# Patient Record
Sex: Female | Born: 1940 | Race: White | Hispanic: No | Marital: Married | Smoking: Never smoker
Health system: Southern US, Community
[De-identification: ages and names within clinical notes are randomized; demographics above are authoritative.]

## PROBLEM LIST (undated history)

## (undated) DIAGNOSIS — D369 Benign neoplasm, unspecified site: Secondary | ICD-10-CM

## (undated) DIAGNOSIS — F419 Anxiety disorder, unspecified: Secondary | ICD-10-CM

## (undated) DIAGNOSIS — K56609 Unspecified intestinal obstruction, unspecified as to partial versus complete obstruction: Secondary | ICD-10-CM

## (undated) DIAGNOSIS — N189 Chronic kidney disease, unspecified: Secondary | ICD-10-CM

## (undated) DIAGNOSIS — M179 Osteoarthritis of knee, unspecified: Secondary | ICD-10-CM

## (undated) DIAGNOSIS — R413 Other amnesia: Secondary | ICD-10-CM

## (undated) DIAGNOSIS — M1710 Unilateral primary osteoarthritis, unspecified knee: Secondary | ICD-10-CM

## (undated) DIAGNOSIS — M419 Scoliosis, unspecified: Secondary | ICD-10-CM

## (undated) DIAGNOSIS — E781 Pure hyperglyceridemia: Secondary | ICD-10-CM

## (undated) DIAGNOSIS — M719 Bursopathy, unspecified: Secondary | ICD-10-CM

## (undated) DIAGNOSIS — K589 Irritable bowel syndrome without diarrhea: Secondary | ICD-10-CM

## (undated) DIAGNOSIS — F410 Panic disorder [episodic paroxysmal anxiety] without agoraphobia: Secondary | ICD-10-CM

## (undated) DIAGNOSIS — Z8719 Personal history of other diseases of the digestive system: Secondary | ICD-10-CM

## (undated) DIAGNOSIS — I679 Cerebrovascular disease, unspecified: Secondary | ICD-10-CM

## (undated) DIAGNOSIS — K649 Unspecified hemorrhoids: Secondary | ICD-10-CM

## (undated) DIAGNOSIS — M1990 Unspecified osteoarthritis, unspecified site: Secondary | ICD-10-CM

## (undated) DIAGNOSIS — I10 Essential (primary) hypertension: Secondary | ICD-10-CM

## (undated) DIAGNOSIS — R269 Unspecified abnormalities of gait and mobility: Secondary | ICD-10-CM

## (undated) DIAGNOSIS — K219 Gastro-esophageal reflux disease without esophagitis: Secondary | ICD-10-CM

## (undated) DIAGNOSIS — H9319 Tinnitus, unspecified ear: Secondary | ICD-10-CM

## (undated) HISTORY — DX: Irritable bowel syndrome, unspecified: K58.9

## (undated) HISTORY — DX: Pure hyperglyceridemia: E78.1

## (undated) HISTORY — DX: Bursopathy, unspecified: M71.9

## (undated) HISTORY — DX: Panic disorder (episodic paroxysmal anxiety): F41.0

## (undated) HISTORY — PX: OTHER SURGICAL HISTORY: SHX169

## (undated) HISTORY — DX: Tinnitus, unspecified ear: H93.19

## (undated) HISTORY — DX: Other amnesia: R41.3

## (undated) HISTORY — DX: Scoliosis, unspecified: M41.9

## (undated) HISTORY — DX: Unspecified abnormalities of gait and mobility: R26.9

## (undated) HISTORY — PX: ROTATOR CUFF REPAIR: SHX139

## (undated) HISTORY — DX: Cerebrovascular disease, unspecified: I67.9

## (undated) HISTORY — DX: Unilateral primary osteoarthritis, unspecified knee: M17.10

## (undated) HISTORY — DX: Chronic kidney disease, unspecified: N18.9

## (undated) HISTORY — DX: Unspecified hemorrhoids: K64.9

## (undated) HISTORY — DX: Benign neoplasm, unspecified site: D36.9

## (undated) HISTORY — DX: Osteoarthritis of knee, unspecified: M17.9

## (undated) HISTORY — PX: ABDOMINAL HYSTERECTOMY: SHX81

---

## 1998-07-23 ENCOUNTER — Other Ambulatory Visit: Admission: RE | Admit: 1998-07-23 | Discharge: 1998-07-23 | Payer: Self-pay | Admitting: General Surgery

## 1998-07-30 ENCOUNTER — Encounter: Payer: Self-pay | Admitting: General Surgery

## 1998-07-30 ENCOUNTER — Ambulatory Visit (HOSPITAL_COMMUNITY): Admission: RE | Admit: 1998-07-30 | Discharge: 1998-07-30 | Payer: Self-pay | Admitting: General Surgery

## 1998-10-16 ENCOUNTER — Ambulatory Visit (HOSPITAL_COMMUNITY): Admission: RE | Admit: 1998-10-16 | Discharge: 1998-10-16 | Payer: Self-pay | Admitting: Gastroenterology

## 2001-10-26 ENCOUNTER — Other Ambulatory Visit: Admission: RE | Admit: 2001-10-26 | Discharge: 2001-10-26 | Payer: Self-pay | Admitting: Obstetrics & Gynecology

## 2001-11-23 ENCOUNTER — Encounter: Payer: Self-pay | Admitting: Emergency Medicine

## 2001-11-23 ENCOUNTER — Emergency Department (HOSPITAL_COMMUNITY): Admission: EM | Admit: 2001-11-23 | Discharge: 2001-11-23 | Payer: Self-pay | Admitting: Emergency Medicine

## 2002-03-21 ENCOUNTER — Ambulatory Visit (HOSPITAL_COMMUNITY): Admission: RE | Admit: 2002-03-21 | Discharge: 2002-03-21 | Payer: Self-pay

## 2002-10-29 ENCOUNTER — Other Ambulatory Visit: Admission: RE | Admit: 2002-10-29 | Discharge: 2002-10-29 | Payer: Self-pay | Admitting: Obstetrics & Gynecology

## 2003-01-29 ENCOUNTER — Encounter: Admission: RE | Admit: 2003-01-29 | Discharge: 2003-04-29 | Payer: Self-pay | Admitting: Internal Medicine

## 2004-03-23 ENCOUNTER — Ambulatory Visit (HOSPITAL_COMMUNITY): Admission: RE | Admit: 2004-03-23 | Discharge: 2004-03-23 | Payer: Self-pay | Admitting: Gastroenterology

## 2007-01-11 ENCOUNTER — Encounter: Admission: RE | Admit: 2007-01-11 | Discharge: 2007-01-11 | Payer: Self-pay | Admitting: Internal Medicine

## 2007-02-22 ENCOUNTER — Encounter: Admission: RE | Admit: 2007-02-22 | Discharge: 2007-02-22 | Payer: Self-pay | Admitting: Internal Medicine

## 2010-07-22 ENCOUNTER — Emergency Department (HOSPITAL_COMMUNITY): Payer: Medicare Other

## 2010-07-22 ENCOUNTER — Emergency Department (HOSPITAL_COMMUNITY)
Admission: EM | Admit: 2010-07-22 | Discharge: 2010-07-23 | Disposition: A | Discharge status: Home / Self Care | Payer: Medicare Other | Attending: Emergency Medicine | Admitting: Emergency Medicine

## 2010-07-22 DIAGNOSIS — K5641 Fecal impaction: Secondary | ICD-10-CM | POA: Insufficient documentation

## 2010-07-22 DIAGNOSIS — R109 Unspecified abdominal pain: Secondary | ICD-10-CM | POA: Insufficient documentation

## 2010-07-22 DIAGNOSIS — I10 Essential (primary) hypertension: Secondary | ICD-10-CM | POA: Insufficient documentation

## 2010-07-22 DIAGNOSIS — Z9889 Other specified postprocedural states: Secondary | ICD-10-CM | POA: Insufficient documentation

## 2010-07-23 ENCOUNTER — Encounter (HOSPITAL_COMMUNITY): Payer: Self-pay | Admitting: Radiology

## 2010-07-23 ENCOUNTER — Emergency Department (HOSPITAL_COMMUNITY): Payer: Medicare Other

## 2010-07-23 ENCOUNTER — Emergency Department (HOSPITAL_COMMUNITY)
Admission: EM | Admit: 2010-07-23 | Discharge: 2010-07-23 | Disposition: A | Discharge status: Home / Self Care | Payer: Medicare Other | Attending: Emergency Medicine | Admitting: Emergency Medicine

## 2010-07-23 DIAGNOSIS — F411 Generalized anxiety disorder: Secondary | ICD-10-CM | POA: Insufficient documentation

## 2010-07-23 DIAGNOSIS — R61 Generalized hyperhidrosis: Secondary | ICD-10-CM | POA: Insufficient documentation

## 2010-07-23 DIAGNOSIS — R079 Chest pain, unspecified: Secondary | ICD-10-CM | POA: Insufficient documentation

## 2010-07-23 DIAGNOSIS — I10 Essential (primary) hypertension: Secondary | ICD-10-CM | POA: Insufficient documentation

## 2010-07-23 DIAGNOSIS — R000 Tachycardia, unspecified: Secondary | ICD-10-CM | POA: Insufficient documentation

## 2010-07-23 DIAGNOSIS — M549 Dorsalgia, unspecified: Secondary | ICD-10-CM | POA: Insufficient documentation

## 2010-07-23 DIAGNOSIS — K8020 Calculus of gallbladder without cholecystitis without obstruction: Secondary | ICD-10-CM | POA: Insufficient documentation

## 2010-07-23 HISTORY — DX: Unspecified intestinal obstruction, unspecified as to partial versus complete obstruction: K56.609

## 2010-07-23 HISTORY — DX: Anxiety disorder, unspecified: F41.9

## 2010-07-23 HISTORY — DX: Essential (primary) hypertension: I10

## 2010-07-23 LAB — BASIC METABOLIC PANEL
Chloride: 97 mEq/L (ref 96–112)
Creatinine, Ser: 1.51 mg/dL — ABNORMAL HIGH (ref 0.4–1.2)
GFR calc Af Amer: 41 mL/min — ABNORMAL LOW (ref >60)
GFR calc non Af Amer: 34 mL/min — ABNORMAL LOW (ref >60)
Potassium: 4.2 mEq/L (ref 3.5–5.1)

## 2010-07-23 LAB — POCT CARDIAC MARKERS
CKMB, poc: 1.2 ng/mL (ref 1.0–8.0)
Myoglobin, poc: 276 ng/mL (ref 12–200)
Troponin i, poc: <0.05 ng/mL (ref 0.00–0.09)
Troponin i, poc: <0.05 ng/mL (ref 0.00–0.09)

## 2010-07-23 LAB — URINALYSIS, ROUTINE W REFLEX MICROSCOPIC
Ketones, ur: NEGATIVE mg/dL
Nitrite: NEGATIVE
Urobilinogen, UA: 0.2 mg/dL (ref 0.0–1.0)
pH: 7 (ref 5.0–8.0)

## 2010-07-23 LAB — DIFFERENTIAL
Lymphocytes Relative: 14 % (ref 12–46)
Lymphs Abs: 1.5 10*3/uL (ref 0.7–4.0)
Monocytes Relative: 9 % (ref 3–12)
Neutro Abs: 8.5 10*3/uL — ABNORMAL HIGH (ref 1.7–7.7)
Neutrophils Relative %: 78 % — ABNORMAL HIGH (ref 43–77)

## 2010-07-23 LAB — URINE MICROSCOPIC-ADD ON

## 2010-07-23 LAB — CBC
Hemoglobin: 11.3 g/dL — ABNORMAL LOW (ref 12.0–15.0)
MCH: 31.5 pg (ref 26.0–34.0)
MCV: 95 fL (ref 78.0–100.0)
RBC: 3.59 MIL/uL — ABNORMAL LOW (ref 3.87–5.11)

## 2010-07-23 LAB — BRAIN NATRIURETIC PEPTIDE: Pro B Natriuretic peptide (BNP): <30 pg/mL (ref 0.0–100.0)

## 2010-07-23 MED ORDER — IOHEXOL 300 MG/ML  SOLN
75.0000 mL | Freq: Once | INTRAMUSCULAR | Status: DC | PRN
  Administered 2010-07-23: 75 mL via INTRAVENOUS

## 2010-07-27 ENCOUNTER — Other Ambulatory Visit (HOSPITAL_COMMUNITY): Payer: Medicare Other

## 2010-11-06 NOTE — Op Note (Signed)
NAMEREEYA, BOUND NO.:  [id number]   MEDICAL RECORD NO.:  [id number]          PATIENT TYPE:  AMB   LOCATION:  ENDO                         FACILITY:  [facility]   PHYSICIAN:  Danise Edge, M.D.   DATE OF BIRTH:  23-Apr-1941   DATE OF PROCEDURE:  03/23/2004  DATE OF DISCHARGE:                                 OPERATIVE REPORT   PROCEDURE:  Colonoscopy.   PROCEDURE INDICATION:  Ms. Curtis Uriarte is a 70 year old female, born  Feb 28, 1941.  Three years ago, Ms. Kienitz underwent a colonoscopy; a  tubulovillous adenomatous polyp was removed from the rectum.   ENDOSCOPIST:  Danise Edge, M.D.   PREMEDICATION:  1.  Versed 9 mg.  2.  Demerol 50 mg.   DESCRIPTION OF PROCEDURE:  After obtaining informed consent, Ms. Copus was  placed in the left lateral decubitus position.  I administered intravenous  Demerol and intravenous Versed to achieve conscious sedation for the  procedure.  The patient's blood pressure, oxygen saturation, and cardiac  rhythm were monitored throughout the procedure and documented in the medical  record.   Anal inspection and digital rectal exam were normal.  The Olympus adjustable  pediatric colonoscope was introduced into the rectum and advanced to the  cecum.  Colonic preparation for the exam today was excellent.   RECTUM:  Normal.  SIGMOID COLON AND DESCENDING COLON:  Normal.  SPLENIC FLEXURE:  Normal.  TRANSVERSE COLON:  Normal.  HEPATIC FLEXURE:  Normal.  ASCENDING COLON:  Normal.  CECUM AND ILEOCECAL VALVE:  Normal.   ASSESSMENT:  Normal proctocolonoscopy to the cecum.   RECOMMENDATIONS:  Repeat colonoscopy in 5 years.      MJ/MEDQ  D:  03/23/2004  T:  03/23/2004  Job:  [id number]   cc:   Theressa Millard, M.D.  [address] [place]  [place] [address]  Fax: [phone]

## 2013-08-01 ENCOUNTER — Other Ambulatory Visit: Payer: Self-pay | Admitting: Gastroenterology

## 2013-08-20 ENCOUNTER — Encounter (HOSPITAL_COMMUNITY): Payer: Self-pay | Admitting: *Deleted

## 2013-08-20 ENCOUNTER — Encounter (HOSPITAL_COMMUNITY): Payer: Self-pay | Admitting: Pharmacy Technician

## 2013-09-11 ENCOUNTER — Encounter (HOSPITAL_COMMUNITY)
Admission: RE | Disposition: A | Discharge status: Home / Self Care | Payer: Self-pay | Source: Ambulatory Visit | Attending: Gastroenterology

## 2013-09-11 ENCOUNTER — Ambulatory Visit (HOSPITAL_COMMUNITY)
Admission: RE | Admit: 2013-09-11 | Discharge: 2013-09-11 | Disposition: A | Discharge status: Home / Self Care | Payer: Medicare Other | Source: Ambulatory Visit | Attending: Gastroenterology | Admitting: Gastroenterology

## 2013-09-11 ENCOUNTER — Encounter (HOSPITAL_COMMUNITY): Payer: Self-pay | Admitting: *Deleted

## 2013-09-11 ENCOUNTER — Ambulatory Visit (HOSPITAL_COMMUNITY): Payer: Medicare Other | Admitting: Anesthesiology

## 2013-09-11 ENCOUNTER — Encounter (HOSPITAL_COMMUNITY): Payer: Medicare Other | Admitting: Anesthesiology

## 2013-09-11 DIAGNOSIS — Z860100 Personal history of colon polyps, unspecified: Secondary | ICD-10-CM | POA: Insufficient documentation

## 2013-09-11 DIAGNOSIS — I10 Essential (primary) hypertension: Secondary | ICD-10-CM | POA: Insufficient documentation

## 2013-09-11 DIAGNOSIS — K449 Diaphragmatic hernia without obstruction or gangrene: Secondary | ICD-10-CM | POA: Insufficient documentation

## 2013-09-11 DIAGNOSIS — I679 Cerebrovascular disease, unspecified: Secondary | ICD-10-CM | POA: Insufficient documentation

## 2013-09-11 DIAGNOSIS — Z1211 Encounter for screening for malignant neoplasm of colon: Secondary | ICD-10-CM | POA: Insufficient documentation

## 2013-09-11 DIAGNOSIS — Z79899 Other long term (current) drug therapy: Secondary | ICD-10-CM | POA: Insufficient documentation

## 2013-09-11 DIAGNOSIS — R110 Nausea: Secondary | ICD-10-CM | POA: Insufficient documentation

## 2013-09-11 DIAGNOSIS — Z90710 Acquired absence of both cervix and uterus: Secondary | ICD-10-CM | POA: Insufficient documentation

## 2013-09-11 DIAGNOSIS — Z7982 Long term (current) use of aspirin: Secondary | ICD-10-CM | POA: Insufficient documentation

## 2013-09-11 DIAGNOSIS — E781 Pure hyperglyceridemia: Secondary | ICD-10-CM | POA: Insufficient documentation

## 2013-09-11 DIAGNOSIS — K219 Gastro-esophageal reflux disease without esophagitis: Secondary | ICD-10-CM | POA: Insufficient documentation

## 2013-09-11 HISTORY — PX: ESOPHAGOGASTRODUODENOSCOPY (EGD) WITH PROPOFOL: SHX5813

## 2013-09-11 HISTORY — DX: Unspecified osteoarthritis, unspecified site: M19.90

## 2013-09-11 HISTORY — PX: COLONOSCOPY WITH PROPOFOL: SHX5780

## 2013-09-11 HISTORY — DX: Gastro-esophageal reflux disease without esophagitis: K21.9

## 2013-09-11 HISTORY — DX: Personal history of other diseases of the digestive system: Z87.19

## 2013-09-11 SURGERY — ESOPHAGOGASTRODUODENOSCOPY (EGD) WITH PROPOFOL
Anesthesia: Monitor Anesthesia Care

## 2013-09-11 MED ORDER — FENTANYL CITRATE 0.05 MG/ML IJ SOLN
INTRAMUSCULAR | Status: AC
  Filled 2013-09-11: qty 2

## 2013-09-11 MED ORDER — SODIUM CHLORIDE 0.9 % IV SOLN: INTRAVENOUS | Status: DC

## 2013-09-11 MED ORDER — PROMETHAZINE HCL 25 MG/ML IJ SOLN: 6.2500 mg | INTRAMUSCULAR | Status: DC | PRN

## 2013-09-11 MED ORDER — LACTATED RINGERS IV SOLN
INTRAVENOUS | Status: DC | PRN
  Administered 2013-09-11: 11:00:00 via INTRAVENOUS

## 2013-09-11 MED ORDER — PROPOFOL 10 MG/ML IV BOLUS
INTRAVENOUS | Status: DC | PRN
  Administered 2013-09-11 (×4): 20 mg via INTRAVENOUS

## 2013-09-11 MED ORDER — FENTANYL CITRATE 0.05 MG/ML IJ SOLN
INTRAMUSCULAR | Status: DC | PRN
  Administered 2013-09-11: 50 ug via INTRAVENOUS

## 2013-09-11 MED ORDER — PROPOFOL 10 MG/ML IV BOLUS
INTRAVENOUS | Status: AC
  Filled 2013-09-11: qty 20

## 2013-09-11 MED ORDER — BUTAMBEN-TETRACAINE-BENZOCAINE 2-2-14 % EX AERO
INHALATION_SPRAY | CUTANEOUS | Status: DC | PRN
  Administered 2013-09-11: 2 via TOPICAL

## 2013-09-11 SURGICAL SUPPLY — 25 items

## 2013-09-11 NOTE — Anesthesia Postprocedure Evaluation (Signed)
  Anesthesia Post-op Note  Patient: Darlene Ball  Procedure(s) Performed: Procedure(s) (LRB): ESOPHAGOGASTRODUODENOSCOPY (EGD) WITH PROPOFOL (N/A) COLONOSCOPY WITH PROPOFOL (N/A)  Patient Location: PACU  Anesthesia Type: MAC  Level of Consciousness: awake and alert   Airway and Oxygen Therapy: Patient Spontanous Breathing  Post-op Pain: mild  Post-op Assessment: Post-op Vital signs reviewed, Patient's Cardiovascular Status Stable, Respiratory Function Stable, Patent Airway and No signs of Nausea or vomiting  Last Vitals:  Filed Vitals:   09/11/13 1224  BP: 123/76  Temp:   Resp: 13    Post-op Vital Signs: stable   Complications: No apparent anesthesia complications

## 2013-09-11 NOTE — Preoperative (Signed)
Beta Blockers   Reason not to administer Beta Blockers:Not Applicable 

## 2013-09-11 NOTE — Anesthesia Preprocedure Evaluation (Signed)
Anesthesia Evaluation  Patient identified by MRN, date of birth, ID band Patient awake    Reviewed: Allergy & Precautions, H&P , NPO status , Patient's Chart, lab work & pertinent test results  Airway Mallampati: II TM Distance: >3 FB Neck ROM: Full    Dental no notable dental hx.    Pulmonary neg pulmonary ROS,  breath sounds clear to auscultation  Pulmonary exam normal       Cardiovascular hypertension, Pt. on medications and Pt. on home beta blockers Rhythm:Regular Rate:Normal     Neuro/Psych negative neurological ROS  negative psych ROS   GI/Hepatic Neg liver ROS, GERD-  Medicated,  Endo/Other  negative endocrine ROS  Renal/GU negative Renal ROS  negative genitourinary   Musculoskeletal negative musculoskeletal ROS (+)   Abdominal   Peds negative pediatric ROS (+)  Hematology negative hematology ROS (+)   Anesthesia Other Findings   Reproductive/Obstetrics negative OB ROS                           Anesthesia Physical Anesthesia Plan  ASA: II  Anesthesia Plan: MAC   Post-op Pain Management:    Induction: Intravenous  Airway Management Planned: Nasal Cannula  Additional Equipment:   Intra-op Plan:   Post-operative Plan:   Informed Consent: I have reviewed the patients History and Physical, chart, labs and discussed the procedure including the risks, benefits and alternatives for the proposed anesthesia with the patient or authorized representative who has indicated his/her understanding and acceptance.   Dental advisory given  Plan Discussed with: CRNA and Surgeon  Anesthesia Plan Comments:         Anesthesia Quick Evaluation

## 2013-09-11 NOTE — Discharge Instructions (Signed)

## 2013-09-11 NOTE — Transfer of Care (Signed)
Immediate Anesthesia Transfer of Care Note  Patient: Darlene Ball  Procedure(s) Performed: Procedure(s): ESOPHAGOGASTRODUODENOSCOPY (EGD) WITH PROPOFOL (N/A) COLONOSCOPY WITH PROPOFOL (N/A)  Patient Location: PACU and Endoscopy Unit  Anesthesia Type:MAC  Level of Consciousness: awake, alert , oriented and patient cooperative  Airway & Oxygen Therapy: Patient Spontanous Breathing and Patient connected to nasal cannula oxygen  Post-op Assessment: Report given to PACU RN, Post -op Vital signs reviewed and stable and Patient moving all extremities  Post vital signs: Reviewed and stable  Complications: No apparent anesthesia complications

## 2013-09-11 NOTE — H&P (Signed)
  Problems: Chronic nausea on paroxetine and aspirin. Personal history of adenomatous colon polyps. Normal surveillance colonoscopy on 04/21/2009.  History: The patient is a 73 year old female born 12-Oct-1940. She has had adenomatous colon polyps removed colonoscopically in the past. She underwent a normal surveillance colonoscopy on 04/21/2009. She is scheduled to undergo a repeat surveillance colonoscopy today.  The patient chronically takes aspirin 81 mg daily plus paroxetine increasing her risk for gastrointestinal bleeding. She does take omeprazole each morning.  The patient has been experiencing early morning nausea on an empty stomach unassociated with abdominal pain, vomiting, heartburn, shortness of breath, or gastrointestinal bleeding. She denies a past history of peptic ulcer disease. She is scheduled to undergo a diagnostic esophagogastroduodenoscopy.  Medication allergies: Codeine causes nausea  Past medical history: Hypertension. Irritable bowel syndrome. Cerebrovascular disease. Panic disorder. Hypertriglyceridemia. Osteoarthritis of the right knee. Gastroesophageal reflux. 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 Hysterectomy. Bunionectomy. Bilateral cataract surgery. Rotator cuff surgery.  Exam: The patient is alert and lying comfortably on the endoscopy stretcher. Abdomen is soft and nontender to palpation. Cardiac exam reveals a regular rhythm. Lungs are clear to auscultation.  Plan: Proceed with surveillance colonoscopy and diagnostic esophagogastroduodenoscopy to evaluate chronic nausea.

## 2013-09-11 NOTE — Op Note (Signed)
Problems: Chronic nausea on aspirin and paroxetine. Personal history of adenomatous colon polyps removed colonoscopically. Normal surveillance colonoscopy performed on 04/21/2009.  Endoscopist: Earle Gell  Premedication: Propofol administered by anesthesia  Procedure: Diagnostic esophagogastroduodenoscopy The patient was placed in the left lateral decubitus position. The Pentax gastroscope was passed through the posterior hypopharynx into the proximal esophagus without difficulty. The hypopharynx, larynx, and vocal cords appeared normal.  Esophagoscopy: The proximal, mid, and lower segments of the esophageal mucosa appeared normal. The squamocolumnar junction was regular and noted at 35 cm from the incisor teeth.  Gastroscopy: There was a moderate sized hiatal hernia. Retroflexed view of the gastric cardia and fundus was normal. The gastric body, antrum, and pylorus appeared normal.  Duodenoscopy: The duodenal bulb and descending duodenum appeared normal.  Assessment: Normal esophagogastroduodenoscopy except for the presence of a moderate sized hiatal hernia  Procedure: Surveillance colonoscopy Anal inspection and digital rectal exam were normal. The Pentax pediatric colonoscope was introduced into the rectum and advanced to the cecum. A normal-appearing ileocecal valve and appendiceal orifice were identified. Colonic preparation for the exam today was good.  Rectum. Normal. Retroflex view of the distal rectum normal  Sigmoid colon and descending colon. Normal  Splenic flexure. Normal  Transverse colon. Normal  Hepatic flexure. Normal  Ascending colon. Normal  Cecum and ileocecal valve. Normal  Assessment: Normal surveillance proctocolonoscopy to the cecum.

## 2013-09-12 ENCOUNTER — Encounter (HOSPITAL_COMMUNITY): Payer: Self-pay | Admitting: Gastroenterology

## 2014-03-14 ENCOUNTER — Ambulatory Visit
Admission: RE | Admit: 2014-03-14 | Discharge: 2014-03-14 | Disposition: A | Discharge status: Home / Self Care | Payer: Medicare Other | Source: Ambulatory Visit | Attending: Internal Medicine | Admitting: Internal Medicine

## 2014-03-14 ENCOUNTER — Other Ambulatory Visit: Payer: Self-pay | Admitting: Internal Medicine

## 2014-03-14 DIAGNOSIS — M25551 Pain in right hip: Principal | ICD-10-CM

## 2014-03-14 DIAGNOSIS — G8929 Other chronic pain: Secondary | ICD-10-CM

## 2014-10-31 ENCOUNTER — Ambulatory Visit (INDEPENDENT_AMBULATORY_CARE_PROVIDER_SITE_OTHER): Payer: Medicare Other | Admitting: Neurology

## 2014-10-31 ENCOUNTER — Encounter: Payer: Self-pay | Admitting: Neurology

## 2014-10-31 ENCOUNTER — Other Ambulatory Visit: Payer: Self-pay | Admitting: Neurology

## 2014-10-31 VITALS — BP 135/74 | HR 74 | Ht 65.0 in | Wt 151.2 lb

## 2014-10-31 DIAGNOSIS — R413 Other amnesia: Secondary | ICD-10-CM

## 2014-10-31 DIAGNOSIS — E538 Deficiency of other specified B group vitamins: Secondary | ICD-10-CM

## 2014-10-31 DIAGNOSIS — R269 Unspecified abnormalities of gait and mobility: Secondary | ICD-10-CM | POA: Diagnosis not present

## 2014-10-31 HISTORY — DX: Other amnesia: R41.3

## 2014-10-31 HISTORY — DX: Unspecified abnormalities of gait and mobility: R26.9

## 2014-10-31 NOTE — Progress Notes (Signed)
Reason for visit: Memory disorder  Referring physician: Dr. Salley Hews Darlene Ball is a 74 y.o. female  History of present illness:  [name] is a 74 year old left-handed white female with a history of a mild memory disturbance that dates back one to two-year's. The patient has had some issues with word finding, remembering recent events. She has had difficulty repeating herself at times. She will misplace things about the house frequently. She still does the finances without difficulty. She stopped driving a car about 7 months ago upon the insistence of her husband, but it was not clear that she was having any safety issues while driving. The patient has also noted some difficulty with mild gait instability that began 1-2 years ago as well. The patient will stumble at times, but she has not had any falls. She does not require a cane for ambulation. She sleeps well, and has a good energy level during the day. She does have some alternating constipation and diarrhea, without bladder or bowel incontinence. She is sent to this office for further evaluation.  Past Medical History  Diagnosis Date  . Hypertension   . Anxiety   . Bowel obstruction   . GERD (gastroesophageal reflux disease)   . H/O hiatal hernia   . Arthritis   . IBS (irritable bowel syndrome)   . Cerebrovascular disease   . Panic disorder   . Hypertriglyceridemia   . OA (osteoarthritis) of knee     right  . Scoliosis   . Bursitis     hips and shoulders  . Tubulovillous adenoma   . Hemorrhoids   . Chronic kidney disease   . Tinnitus   . Memory difficulty 10/31/2014  . Gait disturbance 10/31/2014    Past Surgical History  Procedure Laterality Date  . Abdominal hysterectomy    . Bunionectomy bilateral    . Catarct surgery bilateral    . Esophagogastroduodenoscopy (egd) with propofol N/A 09/11/2013    Procedure: ESOPHAGOGASTRODUODENOSCOPY (EGD) WITH PROPOFOL;  Surgeon: Garlan Fair, MD;  Location: [facility]  ENDOSCOPY;  Service: Endoscopy;  Laterality: N/A;  . Colonoscopy with propofol N/A 09/11/2013    Procedure: COLONOSCOPY WITH PROPOFOL;  Surgeon: Garlan Fair, MD;  Location: [facility] ENDOSCOPY;  Service: Endoscopy;  Laterality: N/A;  . Rotator cuff repair      Family History  Problem Relation Age of Onset  . Breast cancer Mother   . Addison's disease Mother   . Dementia Mother   . Stroke Father   . Addison's disease Maternal Aunt     Social history:  reports that she has never smoked. She has never used smokeless tobacco. She reports that she does not drink alcohol or use illicit drugs.  Medications:  Prior to Admission medications   Medication Sig Start Date End Date Taking? Authorizing Provider  Calcium Carbonate-Vitamin D (CALCIUM 600+D) 600-400 MG-UNIT per tablet Take 1 tablet by mouth 2 (two) times daily.   Yes Historical Provider, MD  clonazePAM (KLONOPIN) 1 MG tablet Take 1 mg by mouth 2 (two) times daily.   Yes Historical Provider, MD  conjugated estrogens (PREMARIN) vaginal cream Place 1 Applicatorful vaginally daily.   Yes Historical Provider, MD  fluticasone (FLONASE) 50 MCG/ACT nasal spray Place 2 sprays into both nostrils daily.   Yes Historical Provider, MD  lisinopril-hydrochlorothiazide (PRINZIDE,ZESTORETIC) 20-12.5 MG per tablet Take 1 tablet by mouth every morning.   Yes Historical Provider, MD  metoprolol tartrate (LOPRESSOR) 25 MG tablet Take 12.5 mg by mouth  2 (two) times daily.   Yes Historical Provider, MD  Multiple Vitamin (MULTIVITAMIN WITH MINERALS) TABS tablet Take 1 tablet by mouth daily.   Yes Historical Provider, MD  omeprazole (PRILOSEC) 40 MG capsule Take 40 mg by mouth daily.   Yes Historical Provider, MD  PARoxetine (PAXIL) 30 MG tablet Take 30 mg by mouth every morning.   Yes Historical Provider, MD  vitamin B-12 (CYANOCOBALAMIN) 1000 MCG tablet Take 1,000 mcg by mouth daily.   Yes Historical Provider, MD      Allergies  Allergen Reactions  . Codeine  Nausea Only    ROS:  Out of a complete 14 system review of symptoms, the patient complains only of the following symptoms, and all other reviewed systems are negative.  Memory loss, confusion, tremor Depression, anxiety, too much sleep Runny nose Diarrhea, constipation Urinary urgency  Blood pressure 135/74, pulse 74, height 5\' 5"  (1.651 m), weight 151 lb 3.2 oz (68.584 kg).  Physical Exam  General: The patient is alert and cooperative at the time of the examination.  Eyes: Pupils are equal, round, and reactive to light. Discs are flat bilaterally.  Neck: The neck is supple, no carotid bruits are noted.  Respiratory: The respiratory examination is clear.  Cardiovascular: The cardiovascular examination reveals a regular rate and rhythm, no obvious murmurs or rubs are noted.  Skin: Extremities are without significant edema.  Neurologic Exam  Mental status: The patient is alert and oriented x 3 at the time of the examination. The patient has apparent normal recent and remote memory, with an apparently normal attention span and concentration ability. Mini-Mental Status Examination done today shows a total score 29/30.  Cranial nerves: Facial symmetry is present. There is good sensation of the face to pinprick and soft touch bilaterally. The strength of the facial muscles and the muscles to head turning and shoulder shrug are normal bilaterally. Speech is well enunciated, no aphasia or dysarthria is noted. Extraocular movements are full. Visual fields are full. The tongue is midline, and the patient has symmetric elevation of the soft palate. No obvious hearing deficits are noted.  Motor: The motor testing reveals 5 over 5 strength of all 4 extremities. Good symmetric motor tone is noted throughout.  Sensory: Sensory testing is intact to pinprick, soft touch, vibration sensation, and position sense on all 4 extremities. No evidence of extinction is noted.  Coordination: Cerebellar  testing reveals good finger-nose-finger and heel-to-shin bilaterally.  Gait and station: Gait is normal. Tandem gait is minimally unsteady. Romberg is negative. No drift is seen.  Reflexes: Deep tendon reflexes are symmetric and normal bilaterally. Toes are downgoing bilaterally.   Assessment/Plan:  1. Mild memory disturbance  2. Mild gait disturbance  The patient will undergo an evaluation for the memory and gait problems. MRI of the brain will be done, and blood work will be done today. If the patient desires, medication such as Aricept may be added in the future. She will follow-up in about 6 months.  Jill Alexanders MD 10/31/2014 7:45 PM  [facility] [address] [place], [place] [address]  Phone [phone] Fax [phone]

## 2014-10-31 NOTE — Patient Instructions (Signed)

## 2014-11-02 LAB — COPPER, SERUM: Copper: 99 ug/dL (ref 72–166)

## 2014-11-02 LAB — VITAMIN B12: VITAMIN B 12: 874 pg/mL (ref 211–946)

## 2014-11-02 LAB — METHYLMALONIC ACID, SERUM: Methylmalonic Acid: 351 nmol/L (ref 0–378)

## 2014-11-02 LAB — RPR: RPR Ser Ql: NONREACTIVE

## 2014-11-05 ENCOUNTER — Telehealth: Payer: Self-pay

## 2014-11-05 NOTE — Telephone Encounter (Signed)
Left voicemail relaying results. 

## 2014-11-05 NOTE — Telephone Encounter (Signed)
-----   Message from Kathrynn Ducking, MD sent at 11/02/2014  5:41 PM EDT -----  The blood work results are unremarkable. Please call the patient.  ----- Message -----    From: [facility] Lab Results In Interface    Sent: 11/01/2014   7:49 AM      To: Kathrynn Ducking, MD

## 2015-04-09 ENCOUNTER — Other Ambulatory Visit: Payer: Self-pay | Admitting: Internal Medicine

## 2015-04-09 DIAGNOSIS — I2583 Coronary atherosclerosis due to lipid rich plaque: Principal | ICD-10-CM

## 2015-04-09 DIAGNOSIS — I2510 Atherosclerotic heart disease of native coronary artery without angina pectoris: Secondary | ICD-10-CM

## 2015-04-14 ENCOUNTER — Other Ambulatory Visit: Payer: Self-pay

## 2015-04-15 ENCOUNTER — Ambulatory Visit
Admission: RE | Admit: 2015-04-15 | Discharge: 2015-04-15 | Disposition: A | Discharge status: Home / Self Care | Payer: Medicare Other | Source: Ambulatory Visit | Attending: Internal Medicine | Admitting: Internal Medicine

## 2015-04-15 DIAGNOSIS — I2510 Atherosclerotic heart disease of native coronary artery without angina pectoris: Secondary | ICD-10-CM

## 2015-04-15 DIAGNOSIS — I2583 Coronary atherosclerosis due to lipid rich plaque: Principal | ICD-10-CM

## 2015-05-06 ENCOUNTER — Ambulatory Visit (INDEPENDENT_AMBULATORY_CARE_PROVIDER_SITE_OTHER): Payer: Medicare Other | Admitting: Adult Health

## 2015-05-06 ENCOUNTER — Encounter: Payer: Self-pay | Admitting: Adult Health

## 2015-05-06 VITALS — BP 108/69 | HR 77 | Ht 65.0 in | Wt 152.0 lb

## 2015-05-06 DIAGNOSIS — R413 Other amnesia: Secondary | ICD-10-CM | POA: Diagnosis not present

## 2015-05-06 MED ORDER — DONEPEZIL HCL 5 MG PO TABS: 5.0000 mg | ORAL_TABLET | Freq: Every day | ORAL | Status: DC

## 2015-05-06 NOTE — Progress Notes (Signed)
PATIENT: Darlene Ball DOB: 01/09/1941  REASON FOR VISIT: follow up- memory HISTORY FROM: patient  HISTORY OF PRESENT ILLNESS: Darlene Ball is a 74 year old female with a history of memory disturbance. She returns today for follow-up. Overall the patient feels that her memory has remained the same. She states that she does have a hard time remembering the date. She is able to complete all ADLs independently. However she does ask her husband for assistance getting in and out of the bathtub. She completes all of the finances however recently she has had some late payments. She no longer cooks meals anymore since they retired. They typically go out to eat. She no longer operates a motor vehicle. Patient feels that her balance is still a little off however she denies any falls. She does not use any assistive device when ambulate. She denies any new neurological symptoms. She returns today for an evaluation.  HISTORY 10/31/14 (WILLIS):Darlene Ball is a 74 year old left-handed white female with a history of a mild memory disturbance that dates back one to two-year's. The patient has had some issues with word finding, remembering recent events. She has had difficulty repeating herself at times. She will misplace things about the house frequently. She still does the finances without difficulty. She stopped driving a car about 7 months ago upon the insistence of her husband, but it was not clear that she was having any safety issues while driving. The patient has also noted some difficulty with mild gait instability that began 1-2 years ago as well. The patient will stumble at times, but she has not had any falls. She does not require a cane for ambulation. She sleeps well, and has a good energy level during the day. She does have some alternating constipation and diarrhea, without bladder or bowel incontinence. She is sent to this office for further evaluation.  REVIEW OF SYSTEMS: Out of a complete 14 system  review of symptoms, the patient complains only of the following symptoms, and all other reviewed systems are negative.  Activity change, eye discharge, runny nose, constipation, diarrhea, urine decrease, back pain, walking difficulty, confusion, memory loss  ALLERGIES: Allergies  Allergen Reactions  . Codeine Nausea Only    HOME MEDICATIONS: Outpatient Prescriptions Prior to Visit  Medication Sig Dispense Refill  . Calcium Carbonate-Vitamin D (CALCIUM 600+D) 600-400 MG-UNIT per tablet Take 1 tablet by mouth 2 (two) times daily.    . clonazePAM (KLONOPIN) 1 MG tablet Take 1 mg by mouth 2 (two) times daily.    Marland Kitchen conjugated estrogens (PREMARIN) vaginal cream Place 1 Applicatorful vaginally daily.    . fluticasone (FLONASE) 50 MCG/ACT nasal spray Place 2 sprays into both nostrils daily.    Marland Kitchen lisinopril-hydrochlorothiazide (PRINZIDE,ZESTORETIC) 20-12.5 MG per tablet Take 1 tablet by mouth every morning.    . metoprolol tartrate (LOPRESSOR) 25 MG tablet Take 12.5 mg by mouth 2 (two) times daily.    . Multiple Vitamin (MULTIVITAMIN WITH MINERALS) TABS tablet Take 1 tablet by mouth daily.    Marland Kitchen omeprazole (PRILOSEC) 40 MG capsule Take 40 mg by mouth daily.    Marland Kitchen PARoxetine (PAXIL) 30 MG tablet Take 30 mg by mouth every morning.    . vitamin B-12 (CYANOCOBALAMIN) 1000 MCG tablet Take 1,000 mcg by mouth daily.     No facility-administered medications prior to visit.    PAST MEDICAL HISTORY: Past Medical History  Diagnosis Date  . Hypertension   . Anxiety   . Bowel obstruction ([place])   .  GERD (gastroesophageal reflux disease)   . H/O hiatal hernia   . Arthritis   . IBS (irritable bowel syndrome)   . Cerebrovascular disease   . Panic disorder   . Hypertriglyceridemia   . OA (osteoarthritis) of knee     right  . Scoliosis   . Bursitis     hips and shoulders  . Tubulovillous adenoma   . Hemorrhoids   . Chronic kidney disease   . Tinnitus   . Memory difficulty 10/31/2014  . Gait  disturbance 10/31/2014    PAST SURGICAL HISTORY: Past Surgical History  Procedure Laterality Date  . Abdominal hysterectomy    . Bunionectomy bilateral    . Catarct surgery bilateral    . Esophagogastroduodenoscopy (egd) with propofol N/A 09/11/2013    Procedure: ESOPHAGOGASTRODUODENOSCOPY (EGD) WITH PROPOFOL;  Surgeon: Garlan Fair, MD;  Location: [facility] ENDOSCOPY;  Service: Endoscopy;  Laterality: N/A;  . Colonoscopy with propofol N/A 09/11/2013    Procedure: COLONOSCOPY WITH PROPOFOL;  Surgeon: Garlan Fair, MD;  Location: [facility] ENDOSCOPY;  Service: Endoscopy;  Laterality: N/A;  . Rotator cuff repair      FAMILY HISTORY: Family History  Problem Relation Age of Onset  . Breast cancer Mother   . Addison's disease Mother   . Dementia Mother   . Stroke Father   . Addison's disease Maternal Aunt     SOCIAL HISTORY: Social History   Social History  . Marital Status: Married    Spouse Name: N/A  . Number of Children: 1  . Years of Education: 12   Occupational History  . retired    Social History Main Topics  . Smoking status: Never Smoker   . Smokeless tobacco: Never Used  . Alcohol Use: No  . Drug Use: No  . Sexual Activity: Not on file   Other Topics Concern  . Not on file   Social History Narrative   Patient drinks caffeine occasionally.   Patient is left handed.      PHYSICAL EXAM  Filed Vitals:   05/06/15 1328  BP: 108/69  Pulse: 77  Height: 5\' 5"  (1.651 m)  Weight: 152 lb (68.947 kg)   Body mass index is 25.29 kg/(m^2).  MMSE - Mini Mental State Exam 05/06/2015 10/31/2014  Orientation to time 4 5  Orientation to Place 5 5  Registration 3 3  Attention/ Calculation 5 5  Recall 3 2  Language- name 2 objects 2 2  Language- repeat 1 1  Language- follow 3 step command 3 3  Language- read & follow direction 1 1  Write a sentence 1 1  Copy design 1 1  Total score 29 29   Generalized: Well developed, in no acute distress   Neurological  examination  Mentation: Alert oriented to time, place, history taking. Follows all commands speech and language fluent Cranial nerve II-XII: Pupils were equal round reactive to light. Extraocular movements were full, visual field were full on confrontational test. Facial sensation and strength were normal. Uvula tongue midline. Head turning and shoulder shrug  were normal and symmetric. Motor: The motor testing reveals 5 over 5 strength of all 4 extremities. Good symmetric motor tone is noted throughout.  Sensory: Sensory testing is intact to soft touch on all 4 extremities. No evidence of extinction is noted.  Coordination: Cerebellar testing reveals good finger-nose-finger and heel-to-shin bilaterally.  Gait and station: Gait is normal. Tandem gait is normal. Romberg is negative. No drift is seen.  Reflexes: Deep tendon reflexes are  symmetric and normal bilaterally.   DIAGNOSTIC DATA (LABS, IMAGING, TESTING) - I reviewed patient records, labs, notes, testing and imaging myself where available.     ASSESSMENT AND PLAN 74 y.o. year old female  has a past medical history of Hypertension; Anxiety; Bowel obstruction ([place]); GERD (gastroesophageal reflux disease); H/O hiatal hernia; Arthritis; IBS (irritable bowel syndrome); Cerebrovascular disease; Panic disorder; Hypertriglyceridemia; OA (osteoarthritis) of knee; Scoliosis; Bursitis; Tubulovillous adenoma; Hemorrhoids; Chronic kidney disease; Tinnitus; Memory difficulty (10/31/2014); and Gait disturbance (10/31/2014). here with:  1. Memory disturbance  Overall the patient's memory has remained stable. Her MMSE today is 29/30. I have spoken to the patient about potentially starting memory medication such as Aricept. The patient is amenable to trying this medication. I will start her on Aricept 5 mg daily. I have informed the patient that taking Aricept in addition to Paxil can present with cholinergic symptoms. I have reviewed the symptoms with the  patient. She verbalized understanding. Patient advised that if her symptoms worsen or she develops any new symptoms she should let us know. She will follow-up in 3-4 months or sooner if needed.  I spent 15 minutes with the patient 50% of this time was spent counseling the patient on the  medication Aricept.   Ward Givens, MSN, NP-C 05/06/2015, 2:33 PM [facility] [address] [place], [place] [address] [phone]

## 2015-05-06 NOTE — Progress Notes (Signed)
I have read the note, and I agree with the clinical assessment and plan.  Braycen Burandt KEITH   

## 2015-05-06 NOTE — Patient Instructions (Addendum)
Memory score is stable.  We will continue to monitor the memory Begin Aricept.   Donepezil tablets What is this medicine? DONEPEZIL (doe NEP e zil) is used to treat mild to moderate dementia caused by Alzheimer's disease. This medicine may be used for other purposes; ask your health care provider or pharmacist if you have questions. What should I tell my health care provider before I take this medicine? They need to know if you have any of these conditions: -asthma or other lung disease -difficulty passing urine -head injury -heart disease -history of irregular heartbeat -liver disease -seizures (convulsions) -stomach or intestinal disease, ulcers or stomach bleeding -an unusual or allergic reaction to donepezil, other medicines, foods, dyes, or preservatives -pregnant or trying to get pregnant -breast-feeding How should I use this medicine? Take this medicine by mouth with a glass of water. Follow the directions on the prescription label. You may take this medicine with or without food. Take this medicine at regular intervals. This medicine is usually taken before bedtime. Do not take it more often than directed. Continue to take your medicine even if you feel better. Do not stop taking except on your doctor's advice. If you are taking the 23 mg donepezil tablet, swallow it whole; do not cut, crush, or chew it. Talk to your pediatrician regarding the use of this medicine in children. Special care may be needed. Overdosage: If you think you have taken too much of this medicine contact a poison control center or emergency room at once. NOTE: This medicine is only for you. Do not share this medicine with others. What if I miss a dose? If you miss a dose, take it as soon as you can. If it is almost time for your next dose, take only that dose, do not take double or extra doses. What may interact with this medicine? Do not take this medicine with any of the following medications: -certain  medicines for fungal infections like itraconazole, fluconazole, posaconazole, and voriconazole -cisapride -dextromethorphan; quinidine -dofetilide -dronedarone -pimozide -quinidine -thioridazine -ziprasidone This medicine may also interact with the following medications: -antihistamines for allergy, cough and cold -atropine -bethanechol -carbamazepine -certain medicines for bladder problems like oxybutynin, tolterodine -certain medicines for Parkinson's disease like benztropine, trihexyphenidyl -certain medicines for stomach problems like dicyclomine, hyoscyamine -certain medicines for travel sickness like scopolamine -dexamethasone -ipratropium -NSAIDs, medicines for pain and inflammation, like ibuprofen or naproxen -other medicines for Alzheimer's disease -other medicines that prolong the QT interval (cause an abnormal heart rhythm) -phenobarbital -phenytoin -rifampin, rifabutin or rifapentine This list may not describe all possible interactions. Give your health care provider a list of all the medicines, herbs, non-prescription drugs, or dietary supplements you use. Also tell them if you smoke, drink alcohol, or use illegal drugs. Some items may interact with your medicine. What should I watch for while using this medicine? Visit your doctor or health care professional for regular checks on your progress. Check with your doctor or health care professional if your symptoms do not get better or if they get worse. You may get drowsy or dizzy. Do not drive, use machinery, or do anything that needs mental alertness until you know how this drug affects you. What side effects may I notice from receiving this medicine? Side effects that you should report to your doctor or health care professional as soon as possible: -allergic reactions like skin rash, itching or hives, swelling of the face, lips, or tongue -changes in vision -feeling faint or lightheaded, falls -problems with  balance -redness, blistering, peeling or loosening of the skin, including inside the mouth -slow heartbeat, or palpitations -stomach pain -unusual bleeding or bruising, red or purple spots on the skin -vomiting -weight loss Side effects that usually do not require medical attention (report to your doctor or health care professional if they continue or are bothersome): -diarrhea, especially when starting treatment -headache -indigestion or heartburn -loss of appetite -muscle cramps -nausea This list may not describe all possible side effects. Call your doctor for medical advice about side effects. You may report side effects to FDA at 1-800-FDA-1088. Where should I keep my medicine? Keep out of reach of children. Store at room temperature between 15 and 30 degrees C (59 and 86 degrees F). Throw away any unused medicine after the expiration date. NOTE: This sheet is a summary. It may not cover all possible information. If you have questions about this medicine, talk to your doctor, pharmacist, or health care provider.    2016, Elsevier/Gold Standard. (2014-01-17 07:51:52)

## 2015-05-07 ENCOUNTER — Telehealth: Payer: Self-pay | Admitting: Adult Health

## 2015-05-13 ENCOUNTER — Telehealth: Payer: Self-pay | Admitting: Adult Health

## 2015-05-13 NOTE — Telephone Encounter (Signed)
I called the patient. She states that she has not operated a motor vehicle in a while. She questioned if she could drive and her husband refused to let her drive. She wanted my opinion on this. I advised the patient that her memory score was 29/30 which is considered mild memory loss. I explained to the patient that I am unsure of what happened in the past to make her husband feel that she is unsafe to drive. I advised the patient that they are welcome to make a appointment with me and we can discuss it  in an office visit. She verbalized understanding. She was advised to call our office to make an appointment if that is what she desires.

## 2015-05-13 NOTE — Telephone Encounter (Signed)
Pt called sts husband won't let her drive. He said her "mind was messed up". She is crying and sts he was very ugly to her. She wants to know if she is capable of driving. She thinks she is. She is inquiring what the donepezil (ARICEPT) 5 MG tablet is for .   Please call and advise.

## 2015-05-26 NOTE — Telephone Encounter (Signed)
error 

## 2015-05-29 ENCOUNTER — Ambulatory Visit
Admission: RE | Admit: 2015-05-29 | Discharge: 2015-05-29 | Disposition: A | Discharge status: Home / Self Care | Payer: Medicare Other | Source: Ambulatory Visit | Attending: Internal Medicine | Admitting: Internal Medicine

## 2015-05-29 ENCOUNTER — Other Ambulatory Visit: Payer: Self-pay | Admitting: Internal Medicine

## 2015-05-29 DIAGNOSIS — R110 Nausea: Secondary | ICD-10-CM

## 2015-05-29 DIAGNOSIS — R0781 Pleurodynia: Secondary | ICD-10-CM

## 2015-06-10 ENCOUNTER — Ambulatory Visit
Admission: RE | Admit: 2015-06-10 | Discharge: 2015-06-10 | Disposition: A | Discharge status: Home / Self Care | Payer: Medicare Other | Source: Ambulatory Visit | Attending: Internal Medicine | Admitting: Internal Medicine

## 2015-06-10 DIAGNOSIS — R110 Nausea: Secondary | ICD-10-CM

## 2015-07-31 ENCOUNTER — Telehealth: Payer: Self-pay | Admitting: Adult Health

## 2015-07-31 MED ORDER — DONEPEZIL HCL 5 MG PO TABS: 5.0000 mg | ORAL_TABLET | Freq: Every day | ORAL | Status: DC

## 2015-07-31 NOTE — Telephone Encounter (Signed)
Patient advised she has a new pharmacy, Rx for donepezil (ARICEPT) needs to go to [facility] [phone].

## 2015-07-31 NOTE — Telephone Encounter (Signed)
Patient called to request refill of donepezil (ARICEPT) 5 MG tablet

## 2015-08-07 ENCOUNTER — Ambulatory Visit (INDEPENDENT_AMBULATORY_CARE_PROVIDER_SITE_OTHER): Payer: Medicare Other | Admitting: Adult Health

## 2015-08-07 ENCOUNTER — Encounter: Payer: Self-pay | Admitting: Adult Health

## 2015-08-07 VITALS — BP 120/68 | HR 86 | Resp 20 | Ht 65.0 in | Wt 150.0 lb

## 2015-08-07 DIAGNOSIS — R413 Other amnesia: Secondary | ICD-10-CM

## 2015-08-07 MED ORDER — DONEPEZIL HCL 10 MG PO TABS: 10.0000 mg | ORAL_TABLET | Freq: Every day | ORAL | Status: DC

## 2015-08-07 NOTE — Progress Notes (Signed)
PATIENT: Darlene Ball DOB: 07-Aug-1940  REASON FOR VISIT: follow up- memory HISTORY FROM: patient  HISTORY OF PRESENT ILLNESS: Darlene Ball is a 75 year old female with a history of memory disturbance. She returns today for follow-up. The patient has been taking Aricept 5 mg daily. She states she tolerates this medication well. She is able to complete all ADLs independently. She does not operate a motor vehicle mainly because her husband likes to drive. The patient will cook occasionally but states that she really reduced her cooking when she retired. She denies having to give up doing anything due to her memory. She denies any new neurological symptoms. She returns today for an evaluation.  HISTORY  05/06/15: Darlene Ball is a 75 year old female with a history of memory disturbance. She returns today for follow-up. Overall the patient feels that her memory has remained the same. She states that she does have a hard time remembering the date. She is able to complete all ADLs independently. However she does ask her husband for assistance getting in and out of the bathtub. She completes all of the finances however recently she has had some late payments. She no longer cooks meals anymore since they retired. They typically go out to eat. She no longer operates a motor vehicle. Patient feels that her balance is still a little off however she denies any falls. She does not use any assistive device when ambulate. She denies any new neurological symptoms. She returns today for an evaluation.  REVIEW OF SYSTEMS: Out of a complete 14 system review of symptoms, the patient complains only of the following symptoms, and all other reviewed systems are negative.  See history of present illness  ALLERGIES: Allergies  Allergen Reactions  . Codeine Nausea Only    HOME MEDICATIONS: Outpatient Prescriptions Prior to Visit  Medication Sig Dispense Refill  . Calcium Carbonate-Vitamin D (CALCIUM 600+D)  600-400 MG-UNIT per tablet Take 1 tablet by mouth 2 (two) times daily.    . clonazePAM (KLONOPIN) 1 MG tablet Take 1 mg by mouth 2 (two) times daily.    Marland Kitchen conjugated estrogens (PREMARIN) vaginal cream Place 1 Applicatorful vaginally daily.    Marland Kitchen donepezil (ARICEPT) 5 MG tablet Take 1 tablet (5 mg total) by mouth at bedtime. 30 tablet 0  . fluticasone (FLONASE) 50 MCG/ACT nasal spray Place 2 sprays into both nostrils daily.    Marland Kitchen lisinopril-hydrochlorothiazide (PRINZIDE,ZESTORETIC) 20-12.5 MG per tablet Take 1 tablet by mouth every morning.    . metoprolol tartrate (LOPRESSOR) 25 MG tablet Take 12.5 mg by mouth 2 (two) times daily.    . Multiple Vitamin (MULTIVITAMIN WITH MINERALS) TABS tablet Take 1 tablet by mouth daily.    Marland Kitchen omeprazole (PRILOSEC) 40 MG capsule Take 40 mg by mouth daily.    Marland Kitchen PARoxetine (PAXIL) 30 MG tablet Take 30 mg by mouth every morning.    . simvastatin (ZOCOR) 10 MG tablet Take 1 tablet by mouth daily.  6  . vitamin B-12 (CYANOCOBALAMIN) 1000 MCG tablet Take 1,000 mcg by mouth daily.     No facility-administered medications prior to visit.    PAST MEDICAL HISTORY: Past Medical History  Diagnosis Date  . Hypertension   . Anxiety   . Bowel obstruction ([place])   . GERD (gastroesophageal reflux disease)   . H/O hiatal hernia   . Arthritis   . IBS (irritable bowel syndrome)   . Cerebrovascular disease   . Panic disorder   . Hypertriglyceridemia   . OA (osteoarthritis)  of knee     right  . Scoliosis   . Bursitis     hips and shoulders  . Tubulovillous adenoma   . Hemorrhoids   . Chronic kidney disease   . Tinnitus   . Memory difficulty 10/31/2014  . Gait disturbance 10/31/2014    PAST SURGICAL HISTORY: Past Surgical History  Procedure Laterality Date  . Abdominal hysterectomy    . Bunionectomy bilateral    . Catarct surgery bilateral    . Esophagogastroduodenoscopy (egd) with propofol N/A 09/11/2013    Procedure: ESOPHAGOGASTRODUODENOSCOPY (EGD) WITH  PROPOFOL;  Surgeon: Garlan Fair, MD;  Location: [facility];  Service: Endoscopy;  Laterality: N/A;  . Colonoscopy with propofol N/A 09/11/2013    Procedure: COLONOSCOPY WITH PROPOFOL;  Surgeon: Garlan Fair, MD;  Location: [facility];  Service: Endoscopy;  Laterality: N/A;  . Rotator cuff repair      FAMILY HISTORY: Family History  Problem Relation Age of Onset  . Breast cancer Mother   . Addison's disease Mother   . Dementia Mother   . Stroke Father   . Addison's disease Maternal Aunt     SOCIAL HISTORY: Social History   Social History  . Marital Status: Married    Spouse Name: N/A  . Number of Children: 1  . Years of Education: 12   Occupational History  . retired    Social History Main Topics  . Smoking status: Never Smoker   . Smokeless tobacco: Never Used  . Alcohol Use: No  . Drug Use: No  . Sexual Activity: Not on file   Other Topics Concern  . Not on file   Social History Narrative   Patient drinks caffeine occasionally.   Patient is left handed.      PHYSICAL EXAM  Filed Vitals:   08/07/15 1253  BP: 120/68  Pulse: 86  Resp: 20  Height: 5\' 5"  (1.651 m)  Weight: 150 lb (68.04 kg)   Body mass index is 24.96 kg/(m^2).   MMSE - Mini Mental State Exam 08/07/2015 05/06/2015 10/31/2014  Orientation to time 4 4 5   Orientation to Place 5 5 5   Registration 3 3 3   Attention/ Calculation 5 5 5   Recall 2 3 2   Language- name 2 objects 2 2 2   Language- repeat 1 1 1   Language- follow 3 step command 3 3 3   Language- read & follow direction 1 1 1   Write a sentence 1 1 1   Copy design 1 1 1   Total score 28 29 29      Generalized: Well developed, in no acute distress   Neurological examination  Mentation: Alert oriented to time, place, history taking. Follows all commands speech and language fluent Cranial nerve II-XII: Pupils were equal round reactive to light. Extraocular movements were full, visual field were full on confrontational test.  Facial sensation and strength were normal. Uvula tongue midline. Head turning and shoulder shrug  were normal and symmetric. Motor: The motor testing reveals 5 over 5 strength of all 4 extremities. Good symmetric motor tone is noted throughout.  Sensory: Sensory testing is intact to soft touch on all 4 extremities. No evidence of extinction is noted.  Coordination: Cerebellar testing reveals good finger-nose-finger and heel-to-shin bilaterally.  Gait and station: Gait is normal. Tandem gait is unsteady. Romberg is negative. No drift is seen.  Reflexes: Deep tendon reflexes are symmetric and normal bilaterally.   DIAGNOSTIC DATA (LABS, IMAGING, TESTING) - I reviewed patient records, labs, notes, testing and imaging  myself where available.      ASSESSMENT AND PLAN 75 y.o. year old female  has a past medical history of Hypertension; Anxiety; Bowel obstruction ([place]); GERD (gastroesophageal reflux disease); H/O hiatal hernia; Arthritis; IBS (irritable bowel syndrome); Cerebrovascular disease; Panic disorder; Hypertriglyceridemia; OA (osteoarthritis) of knee; Scoliosis; Bursitis; Tubulovillous adenoma; Hemorrhoids; Chronic kidney disease; Tinnitus; Memory difficulty (10/31/2014); and Gait disturbance (10/31/2014). here with:  1. Mild memory disturbance  Patient's memory has remained stable. MMSE today is 28/30. She will increase Aricept to 10 mg daily. Patient advised that if her symptoms worsen or she develops any new symptoms she should let us know. She will follow-up in 6 months or sooner if needed.  Ward Givens, MSN, NP-C 08/07/2015, 1:08 PM [facility] [address], [place], [place] [address] [phone]

## 2015-08-07 NOTE — Progress Notes (Signed)
I have read the note, and I agree with the clinical assessment and plan.  WILLIS,CHARLES KEITH   

## 2015-08-07 NOTE — Patient Instructions (Signed)
Continue Aricept- increase to 10 mg daily Memory score is stable If your symptoms worsen or you develop new symptoms please let us know.

## 2015-09-04 ENCOUNTER — Other Ambulatory Visit: Payer: Self-pay | Admitting: Adult Health

## 2015-09-13 ENCOUNTER — Other Ambulatory Visit: Payer: Self-pay | Admitting: Adult Health

## 2015-09-17 ENCOUNTER — Other Ambulatory Visit: Payer: Self-pay | Admitting: Adult Health

## 2015-09-18 NOTE — Telephone Encounter (Signed)
Attempted to call pt, no answer.  aricept 10mg  po daily ordered 08-08-15.  ? Request for 5 mg?

## 2015-10-02 NOTE — Telephone Encounter (Signed)
Called pharmacy, pt picked up refill.

## 2015-10-07 ENCOUNTER — Other Ambulatory Visit: Payer: Self-pay | Admitting: Surgery

## 2015-10-07 DIAGNOSIS — K449 Diaphragmatic hernia without obstruction or gangrene: Principal | ICD-10-CM

## 2015-10-07 DIAGNOSIS — K219 Gastro-esophageal reflux disease without esophagitis: Secondary | ICD-10-CM

## 2015-10-16 ENCOUNTER — Other Ambulatory Visit: Payer: Self-pay | Admitting: Surgery

## 2015-10-16 ENCOUNTER — Ambulatory Visit
Admission: RE | Admit: 2015-10-16 | Discharge: 2015-10-16 | Disposition: A | Discharge status: Home / Self Care | Payer: Medicare Other | Source: Ambulatory Visit | Attending: Surgery | Admitting: Surgery

## 2015-10-16 DIAGNOSIS — K219 Gastro-esophageal reflux disease without esophagitis: Secondary | ICD-10-CM

## 2015-10-16 DIAGNOSIS — K449 Diaphragmatic hernia without obstruction or gangrene: Principal | ICD-10-CM

## 2015-11-05 ENCOUNTER — Other Ambulatory Visit (HOSPITAL_COMMUNITY): Payer: Self-pay | Admitting: General Surgery

## 2015-11-05 DIAGNOSIS — K449 Diaphragmatic hernia without obstruction or gangrene: Principal | ICD-10-CM

## 2015-11-05 DIAGNOSIS — K219 Gastro-esophageal reflux disease without esophagitis: Secondary | ICD-10-CM

## 2015-11-12 ENCOUNTER — Ambulatory Visit (HOSPITAL_COMMUNITY)
Admission: RE | Admit: 2015-11-12 | Discharge: 2015-11-12 | Disposition: A | Discharge status: Home / Self Care | Payer: Medicare Other | Source: Ambulatory Visit | Attending: General Surgery | Admitting: General Surgery

## 2015-11-12 DIAGNOSIS — K449 Diaphragmatic hernia without obstruction or gangrene: Secondary | ICD-10-CM | POA: Insufficient documentation

## 2015-11-12 DIAGNOSIS — K219 Gastro-esophageal reflux disease without esophagitis: Secondary | ICD-10-CM | POA: Insufficient documentation

## 2015-11-12 MED ORDER — TECHNETIUM TC 99M SULFUR COLLOID: 2.0000 | Freq: Once | INTRAVENOUS | Status: DC | PRN

## 2015-11-21 ENCOUNTER — Ambulatory Visit: Payer: Self-pay | Admitting: General Surgery

## 2015-11-21 NOTE — H&P (Signed)
History of Present Illness Ralene Ok MD; 11/04/2015 2:24 PM) The patient is a 75 year old female who presents with a hiatal hernia. The patient is a 75 year old female who is referred to me by Dr. Georgette Dover for evaluation of a moderate size hiatal hernia. The patient states she's had approximately 6 months of abdominal pain, nausea, dysphagia with liquids and solids. She does state that this occurs on a daily basis. Patient underwent a EGD by Dr. Earle Gell which revealed a hiatal hernia. Patient recently underwent upper GI which revealed a moderate size hiatal hernia.  Patient has had previous ovarian surgery. This was done laparoscopically.   Allergies Shyrl Numbers, LPN; [id number] QA348G PM) Codeine Sulfate *ANALGESICS - OPIOID*  Medication History Shyrl Numbers, LPN; [id number] QA348G PM) ClonazePAM (1MG  Tablet, Oral) Active. Calcium Carbonate (600MG  Tablet, Oral) Active. Premarin (0.625MG /GM Cream, Vaginal) Active. Donepezil HCl (10MG  Tablet, Oral) Active. Flonase (50MCG/ACT Suspension, Nasal) Active. Lisinopril-Hydrochlorothiazide (20-12.5MG  Tablet, Oral) Active. Metoprolol Tartrate (25MG  Tablet, Oral) Active. Multiple Vitamins (Oral) Active. Omeprazole (40MG  Capsule DR, Oral) Active. PARoxetine HCl (30MG  Tablet, Oral) Active. Simvastatin (10MG  Tablet, Oral) Active. Vitamin B12 (1000MCG Tablet ER, Oral) Active. Medications Reconciled  Vitals Joelene Millin F. Turpin LPN; [id number] QA348G PM) 11/04/2015 1:50 PM Weight: 146.4 lb Height: 65in Height was reported by patient. Body Surface Area: 1.73 m Body Mass Index: 24.36 kg/m  Temp.: 98.23F(Oral)  Pulse: 66 (Regular)  Resp.: 18 (Unlabored)  P.OX: 96% (Room air) BP: 122/70 (Sitting, Left Arm, Standard)       Physical Exam Ralene Ok MD; 11/04/2015 2:24 PM) General Mental Status-Alert. General Appearance-Consistent with stated age. Hydration-Well  hydrated. Voice-Normal.  Head and Neck Head-normocephalic, atraumatic with no lesions or palpable masses. Trachea-midline. Thyroid Gland Characteristics - normal size and consistency.  Eye Eyeball - Bilateral-Extraocular movements intact. Sclera/Conjunctiva - Bilateral-No scleral icterus.  Chest and Lung Exam Chest and lung exam reveals -quiet, even and easy respiratory effort with no use of accessory muscles and on auscultation, normal breath sounds, no adventitious sounds and normal vocal resonance. Inspection Chest Wall - Normal. Back - normal.  Breast Breast - Left-Symmetric, Non Tender, No Biopsy scars, no Dimpling, No Inflammation, No Lumpectomy scars, No Mastectomy scars, No Peau d' Orange. Breast - Right-Symmetric, Non Tender, No Biopsy scars, no Dimpling, No Inflammation, No Lumpectomy scars, No Mastectomy scars, No Peau d' Orange. Breast Lump-No Palpable Breast Mass.  Cardiovascular Cardiovascular examination reveals -normal heart sounds, regular rate and rhythm with no murmurs and normal pedal pulses bilaterally.  Abdomen Inspection Inspection of the abdomen reveals - No Hernias. Skin - Scar - no surgical scars. Palpation/Percussion Palpation and Percussion of the abdomen reveal - Soft, Non Tender, No Rebound tenderness, No Rigidity (guarding) and No hepatosplenomegaly. Auscultation Auscultation of the abdomen reveals - Bowel sounds normal.  Neurologic Neurologic evaluation reveals -alert and oriented x 3 with no impairment of recent or remote memory. Mental Status-Normal.  Musculoskeletal Normal Exam - Left-Upper Extremity Strength Normal and Lower Extremity Strength Normal. Normal Exam - Right-Upper Extremity Strength Normal and Lower Extremity Strength Normal.  Lymphatic Head & Neck  General Head & Neck Lymphatics: Bilateral - Description - Normal. Axillary  General Axillary Region: Bilateral - Description - Normal. Tenderness  - Non Tender. Femoral & Inguinal  Generalized Femoral & Inguinal Lymphatics: Bilateral - Description - Normal. Tenderness - Non Tender.    Assessment & Plan Ralene Ok MD; 11/04/2015 2:27 PM) HIATAL HERNIA WITH GERD (K21.9) Impression: Patient is a 75 year old female with a moderate size  hiatal hernia and associated dysphagia.  1. We'll proceed with a robotic hiatal hernia repair and Nissen fundoplication 2. Discussed the procedure, hiatal hernia repair with mesh, Nissen fundoplication, in detail with the patient and her husband. I also discussed with him the risks and benefits of the procedure to include but not limited to: Infection, bleeding, damage to any structures, possible pneumothorax, possible esophageal leak. The patient voiced understanding.

## 2016-01-09 ENCOUNTER — Encounter (HOSPITAL_COMMUNITY)
Admission: RE | Admit: 2016-01-09 | Discharge: 2016-01-09 | Disposition: A | Discharge status: Home / Self Care | Payer: Medicare Other | Source: Ambulatory Visit | Attending: General Surgery | Admitting: General Surgery

## 2016-01-09 ENCOUNTER — Encounter (HOSPITAL_COMMUNITY): Payer: Self-pay

## 2016-01-09 DIAGNOSIS — K449 Diaphragmatic hernia without obstruction or gangrene: Secondary | ICD-10-CM | POA: Insufficient documentation

## 2016-01-09 DIAGNOSIS — Z01818 Encounter for other preprocedural examination: Secondary | ICD-10-CM | POA: Insufficient documentation

## 2016-01-09 DIAGNOSIS — Z01812 Encounter for preprocedural laboratory examination: Secondary | ICD-10-CM | POA: Diagnosis not present

## 2016-01-09 DIAGNOSIS — I10 Essential (primary) hypertension: Secondary | ICD-10-CM | POA: Diagnosis not present

## 2016-01-09 HISTORY — DX: Other amnesia: R41.3

## 2016-01-09 LAB — CBC
HCT: 35.1 % — ABNORMAL LOW (ref 36.0–46.0)
Hemoglobin: 11.2 g/dL — ABNORMAL LOW (ref 12.0–15.0)
MCH: 31.8 pg (ref 26.0–34.0)
MCHC: 31.9 g/dL (ref 30.0–36.0)
MCV: 99.7 fL (ref 78.0–100.0)
PLATELETS: 243 10*3/uL (ref 150–400)
RBC: 3.52 MIL/uL — ABNORMAL LOW (ref 3.87–5.11)
RDW: 12.4 % (ref 11.5–15.5)
WBC: 6.1 10*3/uL (ref 4.0–10.5)

## 2016-01-09 LAB — BASIC METABOLIC PANEL
ANION GAP: 6 (ref 5–15)
BUN: 20 mg/dL (ref 6–20)
CALCIUM: 10.1 mg/dL (ref 8.9–10.3)
CO2: 29 mmol/L (ref 22–32)
CREATININE: 1.31 mg/dL — AB (ref 0.44–1.00)
Chloride: 104 mmol/L (ref 101–111)
GFR, EST AFRICAN AMERICAN: 45 mL/min — AB (ref >60)
GFR, EST NON AFRICAN AMERICAN: 39 mL/min — AB (ref >60)
Glucose, Bld: 101 mg/dL — ABNORMAL HIGH (ref 65–99)
Potassium: 4.4 mmol/L (ref 3.5–5.1)
SODIUM: 139 mmol/L (ref 135–145)

## 2016-01-09 NOTE — Patient Instructions (Addendum)
BRIONCA SHERPA  01/09/2016   Your procedure is scheduled on: 01/14/16  Report to [facility] Main  Entrance take [facility]  elevators to 3rd floor to  [place] at 6:15 AM.  Call this number if you have problems the morning of surgery [phone]   Remember: ONLY 1 PERSON MAY GO WITH YOU TO SHORT STAY TO GET  READY MORNING OF [place].  Do not eat food or drink liquids :After Midnight.     Take these medicines the morning of surgery with A SIP OF WATER: Clonazepam (Klonopin), Metoprolol (Lopressor), Omeprazole (Prilosec), Paroxetine (Paxil), Flonase if needed.                                You may not have any metal on your body including hair pins and              piercings  Do not wear jewelry, make-up, lotions, powders or perfumes, deodorant             Do not wear nail polish.  Do not shave  48 hours prior to surgery.              Men may shave face and neck.   Do not bring valuables to the hospital. [place].  Contacts, dentures or bridgework may not be worn into surgery.  Leave suitcase in the car. After surgery it may be brought to your room.               Please read over the following fact sheets you were given: _____________________________________________________________________             [facility] - Preparing for Surgery Before surgery, you can play an important role.  Because skin is not sterile, your skin needs to be as free of germs as possible.  You can reduce the number of germs on your skin by washing with CHG (chlorahexidine gluconate) soap before surgery.  CHG is an antiseptic cleaner which kills germs and bonds with the skin to continue killing germs even after washing. Please DO NOT use if you have an allergy to CHG or antibacterial soaps.  If your skin becomes reddened/irritated stop using the CHG and inform your nurse when you arrive at Short Stay. Do not shave (including  legs and underarms) for at least 48 hours prior to the first CHG shower.  You may shave your face/neck. Please follow these instructions carefully:  1.  Shower with CHG Soap the night before surgery and the  morning of Surgery.  2.  If you choose to wash your hair, wash your hair first as usual with your  normal  shampoo.  3.  After you shampoo, rinse your hair and body thoroughly to remove the  shampoo.                           4.  Use CHG as you would any other liquid soap.  You can apply chg directly  to the skin and wash                       Gently with a scrungie or clean washcloth.  5.  Apply the CHG Soap to your body ONLY FROM THE NECK DOWN.   Do not use on face/ open                           Wound or open sores. Avoid contact with eyes, ears mouth and genitals (private parts).                       Wash face,  Genitals (private parts) with your normal soap.             6.  Wash thoroughly, paying special attention to the area where your surgery  will be performed.  7.  Thoroughly rinse your body with warm water from the neck down.  8.  DO NOT shower/wash with your normal soap after using and rinsing off  the CHG Soap.                9.  Pat yourself dry with a clean towel.            10.  Wear clean pajamas.            11.  Place clean sheets on your bed the night of your first shower and do not  sleep with pets. Day of Surgery : Do not apply any lotions/deodorants the morning of surgery.  Please wear clean clothes to the hospital/surgery center.  FAILURE TO FOLLOW THESE INSTRUCTIONS MAY RESULT IN THE CANCELLATION OF YOUR SURGERY PATIENT SIGNATURE_________________________________  NURSE SIGNATURE__________________________________  ________________________________________________________________________

## 2016-01-13 ENCOUNTER — Encounter (HOSPITAL_COMMUNITY): Payer: Self-pay | Admitting: Anesthesiology

## 2016-01-13 NOTE — Anesthesia Preprocedure Evaluation (Addendum)
Anesthesia Evaluation  Patient identified by MRN, date of birth, ID band Patient awake    Reviewed: Allergy & Precautions, NPO status , Patient's Chart, lab work & pertinent test results  Airway Mallampati: III  TM Distance: >3 FB Neck ROM: Full    Dental  (+) Teeth Intact   Pulmonary neg pulmonary ROS,    Pulmonary exam normal breath sounds clear to auscultation       Cardiovascular hypertension, Pt. on medications and Pt. on home beta blockers Normal cardiovascular exam Rhythm:Regular Rate:Normal  EKG- NSR with poor R wave progression   Neuro/Psych PSYCHIATRIC DISORDERS Anxiety Memory loss     GI/Hepatic Neg liver ROS, hiatal hernia, GERD  Medicated and Controlled,  Endo/Other  negative endocrine ROS  Renal/GU Renal InsufficiencyRenal disease  negative genitourinary   Musculoskeletal  (+) Arthritis , Osteoarthritis,  Scoliosis   Abdominal   Peds  Hematology  (+) anemia ,   Anesthesia Other Findings Receding mandible  Reproductive/Obstetrics                            Anesthesia Physical Anesthesia Plan  ASA: III  Anesthesia Plan: General   Post-op Pain Management:    Induction: Intravenous, Rapid sequence and Cricoid pressure planned  Airway Management Planned: Oral ETT and Video Laryngoscope Planned  Additional Equipment:   Intra-op Plan:   Post-operative Plan: Extubation in OR  Informed Consent: I have reviewed the patients History and Physical, chart, labs and discussed the procedure including the risks, benefits and alternatives for the proposed anesthesia with the patient or authorized representative who has indicated his/her understanding and acceptance.   Dental advisory given  Plan Discussed with: CRNA, Anesthesiologist and Surgeon  Anesthesia Plan Comments:        Anesthesia Quick Evaluation

## 2016-01-14 ENCOUNTER — Encounter (HOSPITAL_COMMUNITY): Payer: Self-pay

## 2016-01-14 ENCOUNTER — Inpatient Hospital Stay (HOSPITAL_COMMUNITY): Payer: Medicare Other | Admitting: Anesthesiology

## 2016-01-14 ENCOUNTER — Inpatient Hospital Stay (HOSPITAL_COMMUNITY)
Admission: RE | Admit: 2016-01-14 | Discharge: 2016-01-16 | DRG: 328 | Disposition: A | Discharge status: Home / Self Care | Payer: Medicare Other | Source: Ambulatory Visit | Attending: General Surgery | Admitting: General Surgery

## 2016-01-14 ENCOUNTER — Encounter (HOSPITAL_COMMUNITY)
Admission: RE | Disposition: A | Discharge status: Home / Self Care | Payer: Self-pay | Source: Ambulatory Visit | Attending: General Surgery

## 2016-01-14 DIAGNOSIS — R1310 Dysphagia, unspecified: Secondary | ICD-10-CM | POA: Diagnosis present

## 2016-01-14 DIAGNOSIS — K219 Gastro-esophageal reflux disease without esophagitis: Secondary | ICD-10-CM | POA: Diagnosis present

## 2016-01-14 DIAGNOSIS — Z79899 Other long term (current) drug therapy: Secondary | ICD-10-CM | POA: Diagnosis not present

## 2016-01-14 DIAGNOSIS — Z9889 Other specified postprocedural states: Secondary | ICD-10-CM | POA: Diagnosis present

## 2016-01-14 DIAGNOSIS — Z885 Allergy status to narcotic agent status: Secondary | ICD-10-CM | POA: Diagnosis not present

## 2016-01-14 DIAGNOSIS — F419 Anxiety disorder, unspecified: Secondary | ICD-10-CM | POA: Diagnosis not present

## 2016-01-14 DIAGNOSIS — Z886 Allergy status to analgesic agent status: Secondary | ICD-10-CM | POA: Diagnosis not present

## 2016-01-14 DIAGNOSIS — K449 Diaphragmatic hernia without obstruction or gangrene: Secondary | ICD-10-CM | POA: Diagnosis present

## 2016-01-14 HISTORY — PX: INSERTION OF MESH: SHX5868

## 2016-01-14 SURGERY — FUNDOPLICATION, NISSEN, ROBOT-ASSISTED, LAPAROSCOPIC
Anesthesia: General | Site: Abdomen

## 2016-01-14 MED ORDER — METOCLOPRAMIDE HCL 5 MG/ML IJ SOLN
INTRAMUSCULAR | Status: AC
Start: 2016-01-14 — End: 2016-01-14
  Filled 2016-01-14: qty 2

## 2016-01-14 MED ORDER — HYDROMORPHONE HCL 1 MG/ML IJ SOLN
1.0000 mg | INTRAMUSCULAR | Status: DC | PRN
  Administered 2016-01-14 – 2016-01-15 (×3): 1 mg via INTRAVENOUS
  Filled 2016-01-14 (×3): qty 1

## 2016-01-14 MED ORDER — CEFAZOLIN SODIUM-DEXTROSE 2-4 GM/100ML-% IV SOLN
2.0000 g | INTRAVENOUS | Status: AC
  Administered 2016-01-14: 2 g via INTRAVENOUS

## 2016-01-14 MED ORDER — DEXAMETHASONE SODIUM PHOSPHATE 10 MG/ML IJ SOLN
INTRAMUSCULAR | Status: DC | PRN
  Administered 2016-01-14: 10 mg via INTRAVENOUS

## 2016-01-14 MED ORDER — DEXTROSE-NACL 5-0.9 % IV SOLN
INTRAVENOUS | Status: DC
  Administered 2016-01-14: 13:00:00 via INTRAVENOUS
  Administered 2016-01-15: 100 mL/h via INTRAVENOUS

## 2016-01-14 MED ORDER — LACTATED RINGERS IV SOLN: INTRAVENOUS | Status: DC

## 2016-01-14 MED ORDER — ROCURONIUM BROMIDE 100 MG/10ML IV SOLN
INTRAVENOUS | Status: AC
  Filled 2016-01-14: qty 1

## 2016-01-14 MED ORDER — 0.9 % SODIUM CHLORIDE (POUR BTL) OPTIME
TOPICAL | Status: DC | PRN
  Administered 2016-01-14: 1000 mL

## 2016-01-14 MED ORDER — HYDRALAZINE HCL 20 MG/ML IJ SOLN: 10.0000 mg | INTRAMUSCULAR | Status: DC | PRN

## 2016-01-14 MED ORDER — CHLORHEXIDINE GLUCONATE 4 % EX LIQD: 1.0000 "application " | Freq: Once | CUTANEOUS | Status: DC

## 2016-01-14 MED ORDER — ENOXAPARIN SODIUM 40 MG/0.4ML ~~LOC~~ SOLN
40.0000 mg | SUBCUTANEOUS | Status: DC
  Administered 2016-01-14: 40 mg via SUBCUTANEOUS
  Filled 2016-01-14 (×3): qty 0.4

## 2016-01-14 MED ORDER — METOCLOPRAMIDE HCL 5 MG/ML IJ SOLN
10.0000 mg | Freq: Once | INTRAMUSCULAR | Status: AC | PRN
  Administered 2016-01-14: 10 mg via INTRAVENOUS

## 2016-01-14 MED ORDER — BUPIVACAINE-EPINEPHRINE 0.25% -1:200000 IJ SOLN
INTRAMUSCULAR | Status: DC | PRN
  Administered 2016-01-14: 19 mL

## 2016-01-14 MED ORDER — SUGAMMADEX SODIUM 200 MG/2ML IV SOLN
INTRAVENOUS | Status: DC | PRN
  Administered 2016-01-14: 150 mg via INTRAVENOUS

## 2016-01-14 MED ORDER — SODIUM CHLORIDE 0.9 % IJ SOLN
INTRAMUSCULAR | Status: AC
  Filled 2016-01-14: qty 10

## 2016-01-14 MED ORDER — FENTANYL CITRATE (PF) 250 MCG/5ML IJ SOLN
INTRAMUSCULAR | Status: AC
  Filled 2016-01-14: qty 5

## 2016-01-14 MED ORDER — ONDANSETRON HCL 4 MG/2ML IJ SOLN
INTRAMUSCULAR | Status: AC
  Filled 2016-01-14: qty 2

## 2016-01-14 MED ORDER — ROCURONIUM BROMIDE 100 MG/10ML IV SOLN
INTRAVENOUS | Status: DC | PRN
  Administered 2016-01-14: 10 mg via INTRAVENOUS
  Administered 2016-01-14: 40 mg via INTRAVENOUS
  Administered 2016-01-14: 5 mg via INTRAVENOUS
  Administered 2016-01-14 (×3): 10 mg via INTRAVENOUS

## 2016-01-14 MED ORDER — LIDOCAINE HCL (CARDIAC) 20 MG/ML IV SOLN
INTRAVENOUS | Status: AC
  Filled 2016-01-14: qty 5

## 2016-01-14 MED ORDER — BUPIVACAINE-EPINEPHRINE (PF) 0.25% -1:200000 IJ SOLN
INTRAMUSCULAR | Status: AC
  Filled 2016-01-14: qty 30

## 2016-01-14 MED ORDER — EPHEDRINE SULFATE 50 MG/ML IJ SOLN
INTRAMUSCULAR | Status: DC | PRN
  Administered 2016-01-14 (×4): 5 mg via INTRAVENOUS

## 2016-01-14 MED ORDER — ACETAMINOPHEN 10 MG/ML IV SOLN
1000.0000 mg | Freq: Once | INTRAVENOUS | Status: AC
  Administered 2016-01-14: 1000 mg via INTRAVENOUS

## 2016-01-14 MED ORDER — FENTANYL CITRATE (PF) 250 MCG/5ML IJ SOLN
INTRAMUSCULAR | Status: DC | PRN
  Administered 2016-01-14 (×3): 25 ug via INTRAVENOUS
  Administered 2016-01-14: 50 ug via INTRAVENOUS
  Administered 2016-01-14: 25 ug via INTRAVENOUS

## 2016-01-14 MED ORDER — MEPERIDINE HCL 50 MG/ML IJ SOLN: 6.2500 mg | INTRAMUSCULAR | Status: DC | PRN

## 2016-01-14 MED ORDER — PROPOFOL 10 MG/ML IV BOLUS
INTRAVENOUS | Status: AC
  Filled 2016-01-14: qty 20

## 2016-01-14 MED ORDER — SUGAMMADEX SODIUM 200 MG/2ML IV SOLN
INTRAVENOUS | Status: AC
  Filled 2016-01-14: qty 2

## 2016-01-14 MED ORDER — ACETAMINOPHEN 10 MG/ML IV SOLN
INTRAVENOUS | Status: AC
Start: 2016-01-14 — End: 2016-01-14
  Filled 2016-01-14: qty 100

## 2016-01-14 MED ORDER — LACTATED RINGERS IR SOLN
Status: DC | PRN
  Administered 2016-01-14: 1000 mL

## 2016-01-14 MED ORDER — DEXAMETHASONE SODIUM PHOSPHATE 10 MG/ML IJ SOLN
INTRAMUSCULAR | Status: AC
  Filled 2016-01-14: qty 1

## 2016-01-14 MED ORDER — PROPOFOL 10 MG/ML IV BOLUS
INTRAVENOUS | Status: DC | PRN
  Administered 2016-01-14: 120 mg via INTRAVENOUS

## 2016-01-14 MED ORDER — EPHEDRINE SULFATE 50 MG/ML IJ SOLN
INTRAMUSCULAR | Status: AC
  Filled 2016-01-14: qty 1

## 2016-01-14 MED ORDER — ONDANSETRON 4 MG PO TBDP: 4.0000 mg | ORAL_TABLET | Freq: Four times a day (QID) | ORAL | Status: DC | PRN

## 2016-01-14 MED ORDER — SUCCINYLCHOLINE CHLORIDE 20 MG/ML IJ SOLN
INTRAMUSCULAR | Status: DC | PRN
  Administered 2016-01-14: 100 mg via INTRAVENOUS

## 2016-01-14 MED ORDER — HYDROMORPHONE HCL 1 MG/ML IJ SOLN: 0.2500 mg | INTRAMUSCULAR | Status: DC | PRN

## 2016-01-14 MED ORDER — ONDANSETRON HCL 4 MG/2ML IJ SOLN
INTRAMUSCULAR | Status: DC | PRN
  Administered 2016-01-14: 4 mg via INTRAVENOUS

## 2016-01-14 MED ORDER — CEFAZOLIN SODIUM-DEXTROSE 2-4 GM/100ML-% IV SOLN
INTRAVENOUS | Status: AC
  Filled 2016-01-14: qty 100

## 2016-01-14 MED ORDER — ONDANSETRON HCL 4 MG/2ML IJ SOLN: 4.0000 mg | Freq: Four times a day (QID) | INTRAMUSCULAR | Status: DC | PRN

## 2016-01-14 MED ORDER — LIDOCAINE HCL (CARDIAC) 20 MG/ML IV SOLN
INTRAVENOUS | Status: DC | PRN
  Administered 2016-01-14: 50 mg via INTRATRACHEAL

## 2016-01-14 MED ORDER — LACTATED RINGERS IV SOLN
INTRAVENOUS | Status: DC | PRN
  Administered 2016-01-14 (×2): via INTRAVENOUS

## 2016-01-14 SURGICAL SUPPLY — 71 items
APL SKNCLS STERI-STRIP NONHPOA (GAUZE/BANDAGES/DRESSINGS)
APPLIER CLIP 5 13 M/L LIGAMAX5 (MISCELLANEOUS)
APPLIER CLIP ROT 10 11.4 M/L (STAPLE)
APR CLP MED LRG 11.4X10 (STAPLE)
APR CLP MED LRG 5 ANG JAW (MISCELLANEOUS)
BENZOIN TINCTURE PRP APPL 2/3 (GAUZE/BANDAGES/DRESSINGS) IMPLANT
BLADE SURG SZ11 CARB STEEL (BLADE) ×3 IMPLANT
CHLORAPREP W/TINT 26ML (MISCELLANEOUS) ×3 IMPLANT
CLIP APPLIE 5 13 M/L LIGAMAX5 (MISCELLANEOUS) IMPLANT
CLIP APPLIE ROT 10 11.4 M/L (STAPLE) IMPLANT
CLIP LIGATING HEM O LOK PURPLE (MISCELLANEOUS) IMPLANT
CLIP LIGATING HEMO O LOK GREEN (MISCELLANEOUS) IMPLANT
CLOSURE WOUND 1/2 X4 (GAUZE/BANDAGES/DRESSINGS)
COVER TIP SHEARS 8 DVNC (MISCELLANEOUS) ×1 IMPLANT
COVER TIP SHEARS 8MM DA VINCI (MISCELLANEOUS) ×2
DECANTER SPIKE VIAL GLASS SM (MISCELLANEOUS) ×3 IMPLANT
DEVICE TROCAR PUNCTURE CLOSURE (ENDOMECHANICALS) IMPLANT
DRAIN PENROSE 18X1/2 LTX STRL (DRAIN) ×3 IMPLANT
DRAPE ARM DVNC X/XI (DISPOSABLE) ×4 IMPLANT
DRAPE COLUMN DVNC XI (DISPOSABLE) ×1 IMPLANT
DRAPE DA VINCI XI ARM (DISPOSABLE) ×8
DRAPE DA VINCI XI COLUMN (DISPOSABLE) ×2
DRAPE LG THREE QUARTER DISP (DRAPES) IMPLANT
ELECT REM PT RETURN 9FT ADLT (ELECTROSURGICAL) ×3
ELECTRODE REM PT RTRN 9FT ADLT (ELECTROSURGICAL) ×1 IMPLANT
ENDOLOOP SUT PDS II  0 18 (SUTURE)
ENDOLOOP SUT PDS II 0 18 (SUTURE) IMPLANT
GAUZE SPONGE 2X2 8PLY STRL LF (GAUZE/BANDAGES/DRESSINGS) IMPLANT
GAUZE SPONGE 4X4 16PLY XRAY LF (GAUZE/BANDAGES/DRESSINGS) ×3 IMPLANT
GLOVE BIO SURGEON STRL SZ7.5 (GLOVE) ×6 IMPLANT
GOWN STRL REUS W/TWL XL LVL3 (GOWN DISPOSABLE) ×12 IMPLANT
IRRIG SUCT STRYKERFLOW 2 WTIP (MISCELLANEOUS) ×3
IRRIGATION SUCT STRKRFLW 2 WTP (MISCELLANEOUS) ×1 IMPLANT
KIT BASIN OR (CUSTOM PROCEDURE TRAY) ×3 IMPLANT
LIQUID BAND (GAUZE/BANDAGES/DRESSINGS) ×2 IMPLANT
MARKER SKIN DUAL TIP RULER LAB (MISCELLANEOUS) ×3 IMPLANT
MESH HERNIA 7X10 (Mesh General) ×2 IMPLANT
NDL INSUFFLATION 14GA 120MM (NEEDLE) ×1 IMPLANT
NEEDLE HYPO 22GX1.5 SAFETY (NEEDLE) ×3 IMPLANT
NEEDLE INSUFFLATION 14GA 120MM (NEEDLE) ×3 IMPLANT
PACK CARDIOVASCULAR III (CUSTOM PROCEDURE TRAY) ×3 IMPLANT
PAD POSITIONING PINK XL (MISCELLANEOUS) ×3 IMPLANT
SCISSORS LAP 5X35 DISP (ENDOMECHANICALS) ×3 IMPLANT
SEAL CANN UNIV 5-8 DVNC XI (MISCELLANEOUS) ×4 IMPLANT
SEAL XI 5MM-8MM UNIVERSAL (MISCELLANEOUS) ×8
SEALER VESSEL DA VINCI XI (MISCELLANEOUS)
SEALER VESSEL EXT DVNC XI (MISCELLANEOUS) IMPLANT
SET BI-LUMEN FLTR TB AIRSEAL (TUBING) ×3 IMPLANT
SLEEVE XCEL OPT CAN 5 100 (ENDOMECHANICALS) IMPLANT
SOLUTION ANTI FOG 6CC (MISCELLANEOUS) ×3 IMPLANT
SOLUTION ELECTROLUBE (MISCELLANEOUS) ×3 IMPLANT
SPONGE GAUZE 2X2 STER 10/PKG (GAUZE/BANDAGES/DRESSINGS)
SPONGE LAP 18X18 X RAY DECT (DISPOSABLE) IMPLANT
STAPLER VISISTAT 35W (STAPLE) IMPLANT
STRIP CLOSURE SKIN 1/2X4 (GAUZE/BANDAGES/DRESSINGS) IMPLANT
SUT ETHIBOND 0 36 GRN (SUTURE) ×4 IMPLANT
SUT MNCRL AB 4-0 PS2 18 (SUTURE) ×7 IMPLANT
SUT SILK 0 SH 30 (SUTURE) ×4 IMPLANT
SUT SILK 2 0 SH (SUTURE) ×4 IMPLANT
SYR 20CC LL (SYRINGE) ×3 IMPLANT
SYRINGE 10CC LL (SYRINGE) ×3 IMPLANT
SYRINGE 60CC LL (MISCELLANEOUS) ×3 IMPLANT
TOWEL OR 17X26 10 PK STRL BLUE (TOWEL DISPOSABLE) ×3 IMPLANT
TOWEL OR NON WOVEN STRL DISP B (DISPOSABLE) ×3 IMPLANT
TRAY FOLEY W/METER SILVER 14FR (SET/KITS/TRAYS/PACK) ×2 IMPLANT
TRAY FOLEY W/METER SILVER 16FR (SET/KITS/TRAYS/PACK) IMPLANT
TROCAR ADV FIXATION 5X100MM (TROCAR) ×3 IMPLANT
TROCAR BLADELESS OPT 5 100 (ENDOMECHANICALS) ×3 IMPLANT
TUBING CONNECTING 10 (TUBING) ×2 IMPLANT
TUBING CONNECTING 10' (TUBING) ×1
TUBING ENDO SMARTCAP PENTAX (MISCELLANEOUS) ×1 IMPLANT

## 2016-01-14 NOTE — Anesthesia Postprocedure Evaluation (Signed)
Anesthesia Post Note  Patient: Darlene Ball  Procedure(s) Performed: Procedure(s) (LRB): XI ROBOTIC ASSISTED LAPAROSCOPIC HIATAL HERNIA REPAIR WITH NISSEN FUNDOPLICATION (N/A) INSERTION OF MESH (N/A)  Patient location during evaluation: PACU Anesthesia Type: General Level of consciousness: awake and alert and oriented Pain management: pain level controlled Vital Signs Assessment: post-procedure vital signs reviewed and stable Respiratory status: spontaneous breathing, nonlabored ventilation, respiratory function stable and patient connected to nasal cannula oxygen Cardiovascular status: blood pressure returned to baseline and stable Postop Assessment: no signs of nausea or vomiting Anesthetic complications: no    Last Vitals:  Vitals:   01/14/16 1200 01/14/16 1219  BP:  125/68  Pulse:  95  Resp:  15  Temp: 36.7 C 36.7 C    Last Pain:  Vitals:   01/14/16 1259  TempSrc:   PainSc: 8                  [name]

## 2016-01-14 NOTE — Anesthesia Procedure Notes (Addendum)
Procedure Name: Intubation Date/Time: 01/14/2016 8:20 AM Performed by: Dione Booze Pre-anesthesia Checklist: Emergency Drugs available, Suction available, Patient identified and Patient being monitored Patient Re-evaluated:Patient Re-evaluated prior to inductionOxygen Delivery Method: Circle system utilized Preoxygenation: Pre-oxygenation with 100% oxygen Intubation Type: IV induction, Rapid sequence and Cricoid Pressure applied Laryngoscope Size: Glidescope and 4 Grade View: Grade I Tube type: Oral Tube size: 7.5 mm Number of attempts: 1 Airway Equipment and Method: Stylet Placement Confirmation: ETT inserted through vocal cords under direct vision,  breath sounds checked- equal and bilateral and positive ETCO2 Secured at: 21 cm Tube secured with: Tape Dental Injury: Teeth and Oropharynx as per pre-operative assessment  Comments: Elective glidescope used, no chin. Grade 1 with glidescope.

## 2016-01-14 NOTE — Op Note (Signed)
01/14/2016  10:56 AM  PATIENT:  Darlene Ball  75 y.o. female  PRE-OPERATIVE DIAGNOSIS:  hiatal hernia   POST-OPERATIVE DIAGNOSIS:  hiatal hernia  PROCEDURE:  Procedure(s): XI ROBOTIC ASSISTED LAPAROSCOPIC HIATAL HERNIA REPAIR WITH NISSEN FUNDOPLICATION (N/A) INSERTION OF MESH (N/A)  SURGEON:  Surgeon(s) and Role:    * Ralene Ok, MD - Primary    * Leighton Ruff, MD - Assisting  ANESTHESIA:   local and general  EBL:  Total I/O In: 1000 [I.V.:1000] Out: 200 [Urine:150; Blood:25]  BLOOD ADMINISTERED:none  DRAINS: none   LOCAL MEDICATIONS USED:  BUPIVICAINE   SPECIMEN:  No Specimen  DISPOSITION OF SPECIMEN:  N/A  COUNTS:  YES  TOURNIQUET:  * No tourniquets in log *  DICTATION: .Dragon Dictation The patient was taken back to the operating room and placed in the supine position with bilateral SCDs in place. The patient was prepped and draped in the usual sterile fashion. After appropriate antibiotics were confirmed a timeout was called and all facts were verified.   A Veress needle technique was used to insufflate the abdomen to 15 mm of mercury the paramedian stab incision. Subsequent to this an 8 mm trocar was introduced as was a 8 millimeter camera. At this time the subsequent robotic trochars x3, were then placed adjacent to this trocar approximately 8-10 cm away. Each trocar was inserted under direct visualization, there were total of 4 trochars. The assistant trocar was then placed in the right lower quadrant under direct visualization. The Nathanson retractor was then visualized inserted into the abdomen and the incision just to the left of the falciform ligament. This was then placed to retract the liver appropriately. At this time the patient was positioned in reverse Trendelenburg.   At this time the robot patient cart was brought to the bedside and placed in good position and the arms were docked to the trochars appropriately. At this time I proceeded to  incised the gastrohepatic ligament.  At this time I proceeded to mobilize the stomach inferiorly and visualize the right crus. The peritoneum over the right crus was incised and right crus was identified. I proceeded to dissect this inferiorly until the left crus was seen joining the right crus. Once the right crus was adequately dissected we turned our to the left crus which was dissected away. This required traction of the stomach to the right side. Once this was visualized we then proceeded to circumferentially dissect the esophagus away from the surrounding tissue. At this time a Penrose drain was placed around the esophagus to help with retraction. At this time the phrenoesophageal fat pad was dissected away from the esophagus. There was a large hiatal hernia seen. I mobilized the esophagus circumferentially, cephalad approximately 3-4 cm, clearing away the surrounding tissue.   At this time we turned our attention to the greater curvature the stomach and the omentum was mobilized using the robotic vessel sealer. This was taken up to the greater curvature to the hiatus. This mobilized the entire greater curvature to allow mobilization and the wrap. I then proceeded to bring the greater curvature the stomach posterior to the esophagus, and a shoeshine technique was used to evaluate the mobilization of the greater curvature.   At this time I proceeded to close the hiatus using 2 interrupted 0 ethibond stitches in a figure of eight fashion. This brought together the hiatal closure without undue stricture to the esophagus.   A piece of BioA  mesh was placed over the hiatal  closure and sutured to the crus using 2-0 silk sutures x 4.  At this time the greater curvature was brought around the esophagus and sutured using 0 silk sutures interrupted fashion approximately 1 cm apart x3. The middle suture incorporated  the esophagus. A left collar stitch was then used to gastropexy the stomach from the wrap tthe  diaphragm just lateral to the left crus as.  A second collar stitch was placed from the wrap to the right crus. The wrap lay at approximately 11:00 on its own with undue tension.  The wrap lay loose with no strangulation of the esophagus.  At this time the robot was undocked. The liver trocar was removed. At this time insufflation was evacuated. Skin was reapproximated for Monocryl subcuticular fashion. The skin was then dressed with Dermabond. The patient tolerated the procedure well and was taken to the recovery room in stable condition.  PLAN OF CARE: Admit to inpatient   PATIENT DISPOSITION:  PACU - hemodynamically stable.   Delay start of Pharmacological VTE agent (>24hrs) due to surgical blood loss or risk of bleeding: yes

## 2016-01-14 NOTE — H&P (Signed)
History of Present Illness The patient is a 75 year old female who presents with a hiatal hernia. The patient is a 75 year old female who is referred to me by Dr. Georgette Dover for evaluation of a moderate size hiatal hernia. The patient states she's had approximately 6 months of abdominal pain, nausea, dysphagia with liquids and solids. She does state that this occurs on a daily basis. Patient underwent a EGD by Dr. Earle Gell which revealed a hiatal hernia. Patient recently underwent upper GI which revealed a moderate size hiatal hernia.  Patient has had previous ovarian surgery. This was done laparoscopically.   Allergies Codeine Sulfate *ANALGESICS - OPIOID*  Medication History  ClonazePAM (1MG  Tablet, Oral) Active. Calcium Carbonate (600MG  Tablet, Oral) Active. Premarin (0.625MG /GM Cream, Vaginal) Active. Donepezil HCl (10MG  Tablet, Oral) Active. Flonase (50MCG/ACT Suspension, Nasal) Active. Lisinopril-Hydrochlorothiazide (20-12.5MG  Tablet, Oral) Active. Metoprolol Tartrate (25MG  Tablet, Oral) Active. Multiple Vitamins (Oral) Active. Omeprazole (40MG  Capsule DR, Oral) Active. PARoxetine HCl (30MG  Tablet, Oral) Active. Simvastatin (10MG  Tablet, Oral) Active. Vitamin B12 (1000MCG Tablet ER, Oral) Active. Medications Reconciled  BP (!) 122/50   Pulse 72   Temp 97.9 F (36.6 C) (Oral)   Resp 16   Ht 5\' 5"  (1.651 m)   Wt 64 kg (141 lb)   SpO2 100%   BMI 23.46 kg/m     Physical Exam General Mental Status-Alert. General Appearance-Consistent with stated age. Hydration-Well hydrated. Voice-Normal.  Head and Neck Head-normocephalic, atraumatic with no lesions or palpable masses. Trachea-midline. Thyroid Gland Characteristics - normal size and consistency.  Eye Eyeball - Bilateral-Extraocular movements intact. Sclera/Conjunctiva - Bilateral-No scleral icterus.  Chest and Lung Exam Chest and lung exam reveals -quiet, even  and easy respiratory effort with no use of accessory muscles and on auscultation, normal breath sounds, no adventitious sounds and normal vocal resonance. Inspection Chest Wall - Normal. Back - normal.  Breast Breast - Left-Symmetric, Non Tender, No Biopsy scars, no Dimpling, No Inflammation, No Lumpectomy scars, No Mastectomy scars, No Peau d' Orange. Breast - Right-Symmetric, Non Tender, No Biopsy scars, no Dimpling, No Inflammation, No Lumpectomy scars, No Mastectomy scars, No Peau d' Orange. Breast Lump-No Palpable Breast Mass.  Cardiovascular Cardiovascular examination reveals -normal heart sounds, regular rate and rhythm with no murmurs and normal pedal pulses bilaterally.  Abdomen Inspection Inspection of the abdomen reveals - No Hernias. Skin - Scar - no surgical scars. Palpation/Percussion Palpation and Percussion of the abdomen reveal - Soft, Non Tender, No Rebound tenderness, No Rigidity (guarding) and No hepatosplenomegaly. Auscultation Auscultation of the abdomen reveals - Bowel sounds normal.  Neurologic Neurologic evaluation reveals -alert and oriented x 3 with no impairment of recent or remote memory. Mental Status-Normal.  Musculoskeletal Normal Exam - Left-Upper Extremity Strength Normal and Lower Extremity Strength Normal. Normal Exam - Right-Upper Extremity Strength Normal and Lower Extremity Strength Normal.  Lymphatic Head & Neck  General Head & Neck Lymphatics: Bilateral - Description - Normal. Axillary  General Axillary Region: Bilateral - Description - Normal. Tenderness - Non Tender. Femoral & Inguinal  Generalized Femoral & Inguinal Lymphatics: Bilateral - Description - Normal. Tenderness - Non Tender.    Assessment & Plan  HIATAL HERNIA WITH GERD (K21.9) Impression: Patient is a 75 year old female with a moderate size hiatal hernia and associated dysphagia.  1. We'll proceed with a robotic hiatal hernia repair and  Nissen fundoplication 2. Discussed the procedure, hiatal hernia repair with mesh, Nissen fundoplication, in detail with the patient and her husband. I also discussed with him the  risks and benefits of the procedure to include but not limited to: Infection, bleeding, damage to any structures, possible pneumothorax, possible esophageal leak. The patient voiced understanding.

## 2016-01-14 NOTE — Transfer of Care (Signed)
Immediate Anesthesia Transfer of Care Note  Patient: Darlene Ball  Procedure(s) Performed: Procedure(s): XI ROBOTIC ASSISTED LAPAROSCOPIC HIATAL HERNIA REPAIR WITH NISSEN FUNDOPLICATION (N/A) INSERTION OF MESH (N/A)  Patient Location: PACU  Anesthesia Type:General  Level of Consciousness: awake, alert  and patient cooperative  Airway & Oxygen Therapy: Patient Spontanous Breathing and Patient connected to face mask oxygen  Post-op Assessment: Report given to RN and Post -op Vital signs reviewed and stable  Post vital signs: Reviewed and stable  Last Vitals:  Vitals:   01/14/16 0629  BP: (!) 122/50  Pulse: 72  Resp: 16  Temp: 36.6 C    Last Pain:  Vitals:   01/14/16 0629  TempSrc: Oral         Complications: No apparent anesthesia complications

## 2016-01-15 ENCOUNTER — Inpatient Hospital Stay (HOSPITAL_COMMUNITY): Payer: Medicare Other

## 2016-01-15 MED ORDER — ACETAMINOPHEN 325 MG PO TABS
650.0000 mg | ORAL_TABLET | ORAL | Status: DC | PRN
  Administered 2016-01-15: 650 mg via ORAL
  Filled 2016-01-15 (×2): qty 2

## 2016-01-15 MED ORDER — HYDROCODONE-ACETAMINOPHEN 7.5-325 MG/15ML PO SOLN
10.0000 mL | ORAL | Status: DC | PRN
  Administered 2016-01-15: 10 mL via ORAL
  Filled 2016-01-15: qty 15

## 2016-01-15 NOTE — Progress Notes (Signed)
1 Day Post-Op  Subjective: Pt doing well this AM.  Some anxiety overnight. Getting out of bed  Objective: Vital signs in last 24 hours: Temp:  [97.4 F (36.3 C)-98.6 F (37 C)] 98 F (36.7 C) (07/27 0544) Pulse Rate:  [57-100] 88 (07/27 0544) Resp:  [14-21] 16 (07/27 0544) BP: (118-137)/(44-70) 135/55 (07/27 0544) SpO2:  [94 %-100 %] 94 % (07/27 0544) Last BM Date: 01/13/16  Intake/Output from previous day: 07/26 0701 - 07/27 0700 In: 3350 [I.V.:3350] Out: 1845 [Urine:1795; Blood:50] Intake/Output this shift: No intake/output data recorded.  General appearance: alert and cooperative GI: soft, approp ttp, nd, incision c/d/i  Assessment/Plan: s/p Procedure(s): XI ROBOTIC ASSISTED LAPAROSCOPIC HIATAL HERNIA REPAIR WITH NISSEN FUNDOPLICATION (N/A) INSERTION OF MESH (N/A) plan for Esophagogram today, if all is well OK to start CLD  PLan for home in AM mobilize  LOS: 1 day    Rosario Jacks., Anne Hahn 01/15/2016

## 2016-01-15 NOTE — Plan of Care (Signed)
Problem: Food- and Nutrition-Related Knowledge Deficit (NB-1.1) Goal: Nutrition education Formal process to instruct or train a patient/client in a skill or to impart knowledge to help patients/clients voluntarily manage or modify food choices and eating behavior to maintain or improve health. Outcome: Completed/Met Date Met: 01/15/16 Nutrition Education Note  RD consulted for nutrition education regarding patient s/p nissen fundoplication.  RD provided "Diet after Nissen Fundoplication" handout from the [employer] of [employer]. Reviewed patient's dietary recall. Provided examples on ways to prevent her stomach from stretching including: Consuming no greater than 4oz. Of fluid with meals and 8oz. Of fluid with snacks. Encouraged pt to avoid gas forming foods and high fat foods. RD discussed why it is important for patient to adhere to diet recommendations, and emphasized the role of fluids, foods to avoid, and importance of weighing self daily. Teach back method used.  Expect fair compliance.  Body mass index is 23.46 kg/m. Pt meets criteria for normal wt based on current BMI.  Current diet order is Clear Liquids, patient is consuming approximately 100% of meals at this time. Labs and medications reviewed. No further nutrition interventions warranted at this time. RD contact information provided. If additional nutrition issues arise, please re-consult RD.   Darlene Ball. Darlene Starks, MS, RD LDN Inpatient Clinical Dietitian Pager [phone]

## 2016-01-16 MED ORDER — HYDROCODONE-ACETAMINOPHEN 7.5-325 MG/15ML PO SOLN: 10.0000 mL | ORAL | 0 refills | Status: DC | PRN

## 2016-01-16 NOTE — Discharge Summary (Signed)
Physician Discharge Summary  Patient ID: Darlene Ball MRN: [id number] DOB/AGE: [birth year] 75 y.o.  Admit date: 01/14/2016 Discharge date: 01/16/2016  Admission Diagnoses:hiatal hernia  Discharge Diagnoses:  Active Problems:   S/P Nissen fundoplication (without gastrostomy tube) procedure   Discharged Condition: good  Hospital Course: PT was admitted s/p robo HHR and nissen fundo.  Pt was admitted to the floor.  She was ambulating on POD 1.  Esophagogram was done POD 1 and showed no leak.  She was started on CLD and tol well.  Pt had good pain control, was ambulating, and afebrile.  She was deemed stable for DC and [facility] home.  Consults: None  Significant Diagnostic Studies: as above  Treatments: surgery: as above  Discharge Exam: Blood pressure (!) 158/60, pulse 88, temperature 98.2 F (36.8 C), temperature source Oral, resp. rate 16, height 5\' 5"  (1.651 m), weight 64 kg (141 lb), SpO2 99 %. General appearance: alert and cooperative GI: soft, non-tender; bowel sounds normal; no masses,  no organomegaly and incisions c/d/i  Disposition: 01-Home or Self Care  Discharge Instructions    Diet - low sodium heart healthy    Complete by:  As directed   Increase activity slowly    Complete by:  As directed       Medication List    TAKE these medications   acetaminophen 325 MG tablet Commonly known as:  TYLENOL Take 650 mg by mouth every 6 (six) hours as needed for mild pain.   aspirin 81 MG tablet Take 81 mg by mouth daily.   CALCIUM 600+D 600-400 MG-UNIT tablet Generic drug:  Calcium Carbonate-Vitamin D Take 1 tablet by mouth 2 (two) times daily.   clonazePAM 1 MG tablet Commonly known as:  KLONOPIN Take 1-1.5 mg by mouth 2 (two) times daily. Pt is to take 1mg  every morning and 1.5mg  at bedtime   donepezil 10 MG tablet Commonly known as:  ARICEPT Take 1 tablet (10 mg total) by mouth at bedtime.   fluticasone 50 MCG/ACT nasal spray Commonly known as:   FLONASE Place 2 sprays into both nostrils daily as needed for allergies.   HAIR SKIN NAILS PO Take 1 tablet by mouth daily.   HYDROcodone-acetaminophen 7.5-325 mg/15 ml solution Commonly known as:  HYCET Take 10 mLs by mouth every 4 (four) hours as needed for moderate pain.   lisinopril-hydrochlorothiazide 20-12.5 MG tablet Commonly known as:  PRINZIDE,ZESTORETIC Take 1 tablet by mouth every morning.   metoprolol tartrate 25 MG tablet Commonly known as:  LOPRESSOR Take 12.5 mg by mouth 2 (two) times daily.   multivitamin with minerals Tabs tablet Take 1 tablet by mouth daily.   omeprazole 40 MG capsule Commonly known as:  PRILOSEC Take 40 mg by mouth daily.   PARoxetine 30 MG tablet Commonly known as:  PAXIL Take 30 mg by mouth every morning.   simvastatin 10 MG tablet Commonly known as:  ZOCOR Take 1 tablet by mouth daily.   vitamin B-12 1000 MCG tablet Commonly known as:  CYANOCOBALAMIN Take 1,000 mcg by mouth daily.      Follow-up Information    Reyes Ivan, MD Follow up in 2 week(s).   Specialty:  General Surgery Contact information: [address] [place] [address] [phone]           Signed: Rosario Jacks., Anne Hahn 01/16/2016, 6:34 AM

## 2016-01-16 NOTE — Discharge Instructions (Signed)
EATING AFTER YOUR ESOPHAGEAL SURGERY (Stomach Fundoplication, Hiatal Hernia repair, Achalasia surgery, etc)  ######################################################################  EAT Start with a pureed / full liquid diet (see below) Gradually transition to a high fiber diet with a fiber supplement over the next month after discharge.    WALK Walk an hour a day.  Control your pain to do that.    CONTROL PAIN Control pain so that you can walk, sleep, tolerate sneezing/coughing, go up/down stairs.  HAVE A BOWEL MOVEMENT DAILY Keep your bowels regular to avoid problems.  OK to try a laxative to override constipation.  OK to use an antidairrheal to slow down diarrhea.  Call if not better after 2 tries  CALL IF YOU HAVE PROBLEMS/CONCERNS Call if you are still struggling despite following these instructions. Call if you have concerns not answered by these instructions  ######################################################################   After your esophageal surgery, expect some sticking with swallowing over the next 1-2 months.    If food sticks when you eat, it is called "dysphagia".  This is due to swelling around your esophagus at the wrap & hiatal diaphragm repair.  It will gradually ease off over the next few months.  To help you through this temporary phase, we start you out on a pureed (blenderized) diet.  Your first meal in the hospital was thin liquids.  You should have been given a pureed diet by the time you left the hospital.  We ask patients to stay on a pureed diet for the first 2-3 weeks to avoid anything getting "stuck" near your recent surgery.  Don't be alarmed if your ability to swallow doesn't progress according to this plan.  Everyone is different and some diets can advance more or less quickly.     Some BASIC RULES to follow are:  Maintain an upright position whenever eating or drinking.  Take small bites - just a teaspoon size bite at a time.  Eat slowly.   It may also help to eat only one food at a time.  Consider nibbling through smaller, more frequent meals & avoid the urge to eat BIG meals  Do not push through feelings of fullness, nausea, or bloatedness  Do not mix solid foods and liquids in the same mouthful  Try not to "wash foods down" with large gulps of liquids.  Avoid carbonated (bubbly/fizzy) drinks.    Avoid foods that make you feel gassy or bloated.  Start with bland foods first.  Wait on trying greasy, fried, or spicy meals until you are tolerating more bland solids well.  Understand that it will be hard to burp and belch at first.  This gradually improves with time.  Expect to be more gassy/flatulent/bloated initially.  Walking will help your body manage it better.  Consider using medications for bloating that contain simethicone such as  Maalox or Gas-X   Eat in a relaxed atmosphere & minimize distractions.  Avoid talking while eating.    Do not use straws.  Following each meal, sit in an upright position (90 degree angle) for 60 to 90 minutes.  Going for a short walk can help as well  If food does stick, don't panic.  Try to relax and let the food pass on its own.  Sipping WARM LIQUID such as strong hot black tea can also help slide it down.   Be gradual in changes & use common sense:  -If you easily tolerating a certain "level" of foods, advance to the next level gradually -If you are  having trouble swallowing a particular food, then avoid it.   -If food is sticking when you advance your diet, go back to thinner previous diet (the lower LEVEL) for 1-2 days.  LEVEL 1 = PUREED DIET  Do for the first 2 WEEKS AFTER SURGERY  -Foods in this group are pureed or blenderized to a smooth, mashed potato-like consistency.  -If necessary, the pureed foods can keep their shape with the addition of a thickening agent.   -Meat should be pureed to a smooth, pasty consistency.  Hot broth or gravy may be added to the pureed  meat, approximately 1 oz. of liquid per 3 oz. serving of meat. -CAUTION:  If any foods do not puree into a smooth consistency, swallowing will be more difficult.  (For example, nuts or seeds sometimes do not blend well.)  Hot Foods Cold Foods  Pureed scrambled eggs and cheese Pureed cottage cheese  Baby cereals Thickened juices and nectars  Thinned cooked cereals (no lumps) Thickened milk or eggnog  Pureed [place] toast or pancakes Ensure  Mashed potatoes Ice cream  Pureed parsley, au gratin, scalloped potatoes, candied sweet potatoes Fruit or [place] ice, sherbet  Pureed buttered or alfredo noodles Plain yogurt  Pureed vegetables (no corn or peas) Instant breakfast  Pureed soups and creamed soups Smooth pudding, mousse, custard  Pureed scalloped apples Whipped gelatin  Gravies Sugar, syrup, honey, jelly  Sauces, cheese, tomato, barbecue, white, creamed Cream  Any baby food Creamer  Alcohol in moderation (not beer or champagne) Margarine  Coffee or tea Mayonnaise   Ketchup, mustard   Apple sauce   SAMPLE MENU:  PUREED DIET Breakfast Lunch Dinner   Orange juice, 1/2 cup  Cream of wheat, 1/2 cup  Pineapple juice, 1/2 cup  Pureed [place], barley soup, 3/4 cup  Pureed Hawaiian chicken, 3 oz   Scrambled eggs, mashed or blended with cheese, 1/2 cup  Tea or coffee, 1 cup   Whole milk, 1 cup   Non-dairy creamer, 2 Tbsp.  Mashed potatoes, 1/2 cup  Pureed cooled broccoli, 1/2 cup  Apple sauce, 1/2 cup  Coffee or tea  Mashed potatoes, 1/2 cup  Pureed spinach, 1/2 cup  Frozen yogurt, 1/2 cup  Tea or coffee      LEVEL 2 = SOFT DIET  After your first 2 weeks, you can advance to a soft diet.   Keep on this diet until everything goes down easily.  Hot Foods Cold Foods  White fish Cottage cheese  Stuffed fish Junior baby fruit  Baby food meals Semi thickened juices  Minced soft cooked, scrambled, poached eggs nectars  Souffle & omelets Ripe mashed bananas  Cooked  cereals Canned fruit, pineapple sauce, milk  potatoes Milkshake  Buttered or Alfredo noodles Custard  Cooked cooled vegetable Puddings, including tapioca  Sherbet Yogurt  Vegetable soup or alphabet soup Fruit ice, [place] ice  Gravies Whipped gelatin  Sugar, syrup, honey, jelly Junior baby desserts  Sauces:  Cheese, creamed, barbecue, tomato, white Cream  Coffee or tea Margarine   SAMPLE MENU:  LEVEL 2 Breakfast Lunch Dinner   Orange juice, 1/2 cup  Oatmeal, 1/2 cup  Scrambled eggs with cheese, 1/2 cup  Decaffeinated tea, 1 cup  Whole milk, 1 cup  Non-dairy creamer, 2 Tbsp  Pineapple juice, 1/2 cup  Minced beef, 3 oz  Gravy, 2 Tbsp  Mashed potatoes, 1/2 cup  Minced fresh broccoli, 1/2 cup  Applesauce, 1/2 cup  Coffee, 1 cup  [place], barley soup, 3/4 cup  Minced Hawaiian chicken, 3 oz  Mashed potatoes, 1/2 cup  Cooked spinach, 1/2 cup  Frozen yogurt, 1/2 cup  Non-dairy creamer, 2 Tbsp      LEVEL 3 = CHOPPED DIET  -After all the foods in level 2 (soft diet) are passing through well you should advance up to more chopped foods.  -It is still important to cut these foods into small pieces and eat slowly.  Hot Foods Cold Foods  Poultry Cottage cheese  Chopped Swedish meatballs Yogurt  Meat salads (ground or flaked meat) Milk  Flaked fish (tuna) Milkshakes  Poached or scrambled eggs Soft, cold, dry cereal  Souffles and omelets Fruit juices or nectars  Cooked cereals Chopped canned fruit  Chopped [place] toast or pancakes Canned fruit cocktail  Noodles or pasta (no rice) Pudding, mousse, custard  Cooked vegetables (no frozen peas, corn, or mixed vegetables) Green salad  Canned small sweet peas Ice cream  Creamed soup or vegetable soup Fruit ice, [place] ice  Pureed vegetable soup or alphabet soup Non-dairy creamer  Ground scalloped apples Margarine  Gravies Mayonnaise  Sauces:  Cheese, creamed, barbecue, tomato, white Ketchup  Coffee or tea Mustard    SAMPLE MENU:  LEVEL 3 Breakfast Lunch Dinner   Orange juice, 1/2 cup  Oatmeal, 1/2 cup  Scrambled eggs with cheese, 1/2 cup  Decaffeinated tea, 1 cup  Whole milk, 1 cup  Non-dairy creamer, 2 Tbsp  Ketchup, 1 Tbsp  Margarine, 1 tsp  Salt, 1/4 tsp  Sugar, 2 tsp  Pineapple juice, 1/2 cup  Ground beef, 3 oz  Gravy, 2 Tbsp  Mashed potatoes, 1/2 cup  Cooked spinach, 1/2 cup  Applesauce, 1/2 cup  Decaffeinated coffee  Whole milk  Non-dairy creamer, 2 Tbsp  Margarine, 1 tsp  Salt, 1/4 tsp  Pureed [place], barley soup, 3/4 cup  Barbecue chicken, 3 oz  Mashed potatoes, 1/2 cup  Ground fresh broccoli, 1/2 cup  Frozen yogurt, 1/2 cup  Decaffeinated tea, 1 cup  Non-dairy creamer, 2 Tbsp  Margarine, 1 tsp  Salt, 1/4 tsp  Sugar, 1 tsp    LEVEL 4:  REGULAR FOODS  -Foods in this group are soft, moist, regularly textured foods.   -This level includes meat and breads, which tend to be the hardest things to swallow.   -Eat very slowly, chew well and continue to avoid carbonated drinks. -most people are at this level in 4-6 weeks  Hot Foods Cold Foods  Baked fish or skinned Soft cheeses - cottage cheese  Souffles and omelets Cream cheese  Eggs Yogurt  Stuffed shells Milk  Spaghetti with meat sauce Milkshakes  Cooked cereal Cold dry cereals (no nuts, dried fruit, coconut)  [place] toast or pancakes Crackers  Buttered toast Fruit juices or nectars  Noodles or pasta (no rice) Canned fruit  Potatoes (all types) Ripe bananas  Soft, cooked vegetables (no corn, lima, or baked beans) Peeled, ripe, fresh fruit  Creamed soups or vegetable soup Cakes (no nuts, dried fruit, coconut)  Canned chicken noodle soup Plain doughnuts  Gravies Ice cream  Bacon dressing Pudding, mousse, custard  Sauces:  Cheese, creamed, barbecue, tomato, white Fruit ice, [place] ice, sherbet  Decaffeinated tea or coffee Whipped gelatin  Pork chops Regular gelatin   Canned fruited  gelatin molds   Sugar, syrup, honey, jam, jelly   Cream   Non-dairy   Margarine   Oil   Mayonnaise   Ketchup   Mustard   TROUBLESHOOTING IRREGULAR BOWELS  1) Avoid extremes of bowel  movements (no bad constipation/diarrhea)  2) Miralax 17gm mixed in [place]. water or juice-daily. May use BID as needed.  3) Gas-x,Phazyme, etc. as needed for gas & bloating.  4) Soft,bland diet. No spicy,greasy,fried foods.  5) Prilosec over-the-counter as needed  6) May hold gluten/wheat products from diet to see if symptoms improve.  7) May try probiotics (Align, Activa, etc) to help calm the bowels down  7) If symptoms become worse call back immediately.    If you have any questions please call our office at [employer]: [phone].  Scheduled Meds:  enoxaparin (LOVENOX) injection  40 mg Subcutaneous Q24H   metoCLOPramide       Continuous Infusions:  dextrose 5 % and 0.9% NaCl 100 mL/hr at 01/14/16 1230   PRN Meds:hydrALAZINE, HYDROmorphone (DILAUDID) injection, ondansetron **OR** ondansetron (ZOFRAN) IV Activity Instructions   You must avoid lifting more than 20 pounds until your physician instructs you differently. You should avoid n/a. You may resume climbing stairs.

## 2016-01-16 NOTE — Progress Notes (Signed)
Patient given discharge instructions, and verbalized an understanding of all discharge instructions.  Patient agrees with discharge plan, and is being discharged in stable medical condition.  

## 2016-02-03 ENCOUNTER — Emergency Department (HOSPITAL_COMMUNITY): Payer: Medicare Other

## 2016-02-03 ENCOUNTER — Encounter (HOSPITAL_COMMUNITY): Payer: Self-pay

## 2016-02-03 ENCOUNTER — Emergency Department (HOSPITAL_COMMUNITY)
Admission: EM | Admit: 2016-02-03 | Discharge: 2016-02-03 | Disposition: A | Discharge status: Home / Self Care | Payer: Medicare Other | Attending: Emergency Medicine | Admitting: Emergency Medicine

## 2016-02-03 DIAGNOSIS — E860 Dehydration: Secondary | ICD-10-CM | POA: Insufficient documentation

## 2016-02-03 DIAGNOSIS — Z79899 Other long term (current) drug therapy: Secondary | ICD-10-CM | POA: Diagnosis not present

## 2016-02-03 DIAGNOSIS — Z7982 Long term (current) use of aspirin: Secondary | ICD-10-CM | POA: Diagnosis not present

## 2016-02-03 DIAGNOSIS — N189 Chronic kidney disease, unspecified: Secondary | ICD-10-CM | POA: Diagnosis not present

## 2016-02-03 DIAGNOSIS — R197 Diarrhea, unspecified: Secondary | ICD-10-CM | POA: Diagnosis not present

## 2016-02-03 DIAGNOSIS — I129 Hypertensive chronic kidney disease with stage 1 through stage 4 chronic kidney disease, or unspecified chronic kidney disease: Secondary | ICD-10-CM | POA: Diagnosis not present

## 2016-02-03 DIAGNOSIS — R1031 Right lower quadrant pain: Secondary | ICD-10-CM | POA: Diagnosis present

## 2016-02-03 LAB — URINALYSIS, ROUTINE W REFLEX MICROSCOPIC
BILIRUBIN URINE: NEGATIVE
GLUCOSE, UA: NEGATIVE mg/dL
HGB URINE DIPSTICK: NEGATIVE
Ketones, ur: NEGATIVE mg/dL
Leukocytes, UA: NEGATIVE
Nitrite: NEGATIVE
PH: 5.5 (ref 5.0–8.0)
Protein, ur: NEGATIVE mg/dL
SPECIFIC GRAVITY, URINE: 1.011 (ref 1.005–1.030)

## 2016-02-03 LAB — BASIC METABOLIC PANEL
ANION GAP: 10 (ref 5–15)
BUN: 23 mg/dL — ABNORMAL HIGH (ref 6–20)
CALCIUM: 9.4 mg/dL (ref 8.9–10.3)
CO2: 21 mmol/L — AB (ref 22–32)
Chloride: 103 mmol/L (ref 101–111)
Creatinine, Ser: 1.57 mg/dL — ABNORMAL HIGH (ref 0.44–1.00)
GFR calc Af Amer: 36 mL/min — ABNORMAL LOW (ref >60)
GFR calc non Af Amer: 31 mL/min — ABNORMAL LOW (ref >60)
GLUCOSE: 124 mg/dL — AB (ref 65–99)
POTASSIUM: 3.7 mmol/L (ref 3.5–5.1)
Sodium: 134 mmol/L — ABNORMAL LOW (ref 135–145)

## 2016-02-03 LAB — C DIFFICILE QUICK SCREEN W PCR REFLEX
C DIFFICILE (CDIFF) INTERP: NOT DETECTED
C Diff antigen: NEGATIVE
C Diff toxin: NEGATIVE

## 2016-02-03 LAB — CBG MONITORING, ED: Glucose-Capillary: 112 mg/dL — ABNORMAL HIGH (ref 65–99)

## 2016-02-03 LAB — CBC
HEMATOCRIT: 33 % — AB (ref 36.0–46.0)
HEMOGLOBIN: 10.8 g/dL — AB (ref 12.0–15.0)
MCH: 31.8 pg (ref 26.0–34.0)
MCHC: 32.7 g/dL (ref 30.0–36.0)
MCV: 97.1 fL (ref 78.0–100.0)
Platelets: 412 10*3/uL — ABNORMAL HIGH (ref 150–400)
RBC: 3.4 MIL/uL — ABNORMAL LOW (ref 3.87–5.11)
RDW: 12.3 % (ref 11.5–15.5)
WBC: 12.1 10*3/uL — ABNORMAL HIGH (ref 4.0–10.5)

## 2016-02-03 LAB — HEPATIC FUNCTION PANEL
ALBUMIN: 3.4 g/dL — AB (ref 3.5–5.0)
ALK PHOS: 163 U/L — AB (ref 38–126)
ALT: 8 U/L — ABNORMAL LOW (ref 14–54)
AST: 15 U/L (ref 15–41)
Bilirubin, Direct: <0.1 mg/dL — ABNORMAL LOW (ref 0.1–0.5)
TOTAL PROTEIN: 7.4 g/dL (ref 6.5–8.1)
Total Bilirubin: 0.8 mg/dL (ref 0.3–1.2)

## 2016-02-03 LAB — I-STAT CG4 LACTIC ACID, ED
LACTIC ACID, VENOUS: 2.4 mmol/L — AB (ref 0.5–1.9)
LACTIC ACID, VENOUS: 1.39 mmol/L (ref 0.5–1.9)

## 2016-02-03 LAB — I-STAT TROPONIN, ED
TROPONIN I, POC: 0 ng/mL (ref 0.00–0.08)
Troponin i, poc: 0 ng/mL (ref 0.00–0.08)

## 2016-02-03 LAB — MAGNESIUM: MAGNESIUM: 1.4 mg/dL — AB (ref 1.7–2.4)

## 2016-02-03 MED ORDER — MAGNESIUM SULFATE 50 % IJ SOLN: 1.0000 g | Freq: Once | INTRAMUSCULAR | Status: DC

## 2016-02-03 MED ORDER — MAGNESIUM SULFATE IN D5W 1-5 GM/100ML-% IV SOLN
1.0000 g | Freq: Once | INTRAVENOUS | Status: AC
  Administered 2016-02-03: 1 g via INTRAVENOUS
  Filled 2016-02-03: qty 100

## 2016-02-03 MED ORDER — SODIUM CHLORIDE 0.9 % IV BOLUS (SEPSIS)
1000.0000 mL | Freq: Once | INTRAVENOUS | Status: AC
  Administered 2016-02-03: 1000 mL via INTRAVENOUS

## 2016-02-03 MED ORDER — DIATRIZOATE MEGLUMINE & SODIUM 66-10 % PO SOLN: 30.0000 mL | Freq: Once | ORAL | Status: DC

## 2016-02-03 MED ORDER — LOPERAMIDE HCL 2 MG PO CAPS: 2.0000 mg | ORAL_CAPSULE | Freq: Four times a day (QID) | ORAL | 0 refills | Status: AC | PRN

## 2016-02-03 MED ORDER — ONDANSETRON HCL 4 MG/2ML IJ SOLN
4.0000 mg | Freq: Once | INTRAMUSCULAR | Status: AC
  Administered 2016-02-03: 4 mg via INTRAVENOUS
  Filled 2016-02-03: qty 2

## 2016-02-03 MED ORDER — LOPERAMIDE HCL 2 MG PO CAPS
2.0000 mg | ORAL_CAPSULE | Freq: Once | ORAL | Status: AC
  Administered 2016-02-03: 2 mg via ORAL
  Filled 2016-02-03: qty 1

## 2016-02-03 NOTE — ED Triage Notes (Signed)
Pt presents with c/o weakness and diarrhea that started last night. Pt reports she has had approx 8 episodes of diarrhea since her symptoms started last night. Pt also c/o weakness. Pt does report that she has some shortness of breath but believes that it is related to anxiety because she is not feeling well.

## 2016-02-03 NOTE — ED Provider Notes (Signed)
[facility] DEPT Provider Note   CSN: [id number] Arrival date & time: 02/03/16  [id number]     History   Chief Complaint Chief Complaint  Patient presents with  . Weakness  . Diarrhea    HPI Darlene Ball is a 75 y.o. female.  HPI   Diarrhea began last night around 11PM, had about 8 episodes.  At first was clumps and water, and now is watery.  Took pepto bismol x2 yesterday and stool looked dark after, but does not think looked black or bloody before.  No vomiting.  Some nausea. No abdominal pain. No recent abx.  Had nissen 20 days ago. No fevers. Had been feeling well until last night.  Has not had anything to suspicious to eat, last night had cream/mashed potatoes, broccoli, not sure if advancing diet appropriately post-Nissen. No sick contacts.   Past Medical History:  Diagnosis Date  . Anxiety   . Arthritis   . Bowel obstruction ([place])   . Bursitis    hips and shoulders  . Cerebrovascular disease   . Chronic kidney disease   . Gait disturbance 10/31/2014  . GERD (gastroesophageal reflux disease)   . H/O hiatal hernia   . Hemorrhoids   . Hypertension   . Hypertriglyceridemia   . IBS (irritable bowel syndrome)   . Memory difficulty 10/31/2014  . Memory loss   . OA (osteoarthritis) of knee    right  . Panic disorder   . Scoliosis   . Tinnitus   . Tubulovillous adenoma     Patient Active Problem List   Diagnosis Date Noted  . 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 Memory difficulty 10/31/2014  . Gait disturbance 10/31/2014    Past Surgical History:  Procedure Laterality Date  . ABDOMINAL HYSTERECTOMY    . Bunionectomy Bilateral    . Catarct Surgery Bilateral    . COLONOSCOPY WITH PROPOFOL N/A 09/11/2013   Procedure: COLONOSCOPY WITH PROPOFOL;  Surgeon: Garlan Fair, MD;  Location: [facility] ENDOSCOPY;  Service: Endoscopy;  Laterality: N/A;  . ESOPHAGOGASTRODUODENOSCOPY (EGD) WITH PROPOFOL N/A 09/11/2013   Procedure:  ESOPHAGOGASTRODUODENOSCOPY (EGD) WITH PROPOFOL;  Surgeon: Garlan Fair, MD;  Location: [facility] ENDOSCOPY;  Service: Endoscopy;  Laterality: N/A;  . INSERTION OF MESH N/A 01/14/2016   Procedure: INSERTION OF MESH;  Surgeon: Ralene Ok, MD;  Location: [facility] ORS;  Service: General;  Laterality: N/A;  . ROTATOR CUFF REPAIR      OB History    No data available       Home Medications    Prior to Admission medications   Medication Sig Start Date End Date Taking? Authorizing Provider  acetaminophen (TYLENOL) 325 MG tablet Take 650 mg by mouth every 6 (six) hours as needed for mild pain.   Yes Historical Provider, MD  aspirin 81 MG tablet Take 81 mg by mouth every evening.    Yes Historical Provider, MD  Calcium Carbonate-Vitamin D (CALCIUM 600+D) 600-400 MG-UNIT per tablet Take 1 tablet by mouth 2 (two) times daily.   Yes Historical Provider, MD  clonazePAM (KLONOPIN) 1 MG tablet Take 1-1.5 mg by mouth 2 (two) times daily. Pt is to take 1mg  every morning and 1.5mg  at bedtime   Yes Historical Provider, MD  donepezil (ARICEPT) 10 MG tablet Take 1 tablet (10 mg total) by mouth at bedtime. 08/07/15  Yes Ward Givens, NP  fluticasone (FLONASE) 50 MCG/ACT nasal spray Place 2 sprays into both nostrils daily as needed for allergies.  Yes Historical Provider, MD  HYDROcodone-acetaminophen (HYCET) 7.5-325 mg/15 ml solution Take 10 mLs by mouth every 4 (four) hours as needed for moderate pain. 01/16/16  Yes Ralene Ok, MD  lisinopril-hydrochlorothiazide (PRINZIDE,ZESTORETIC) 20-12.5 MG per tablet Take 1 tablet by mouth every morning.   Yes Historical Provider, MD  metoprolol tartrate (LOPRESSOR) 25 MG tablet Take 12.5 mg by mouth 2 (two) times daily.   Yes Historical Provider, MD  Multiple Vitamin (MULTIVITAMIN WITH MINERALS) TABS tablet Take 1 tablet by mouth daily.   Yes Historical Provider, MD  Multiple Vitamins-Minerals (HAIR SKIN NAILS PO) Take 1 tablet by mouth daily.   Yes Historical Provider,  MD  omeprazole (PRILOSEC) 40 MG capsule Take 40 mg by mouth every evening.    Yes Historical Provider, MD  PARoxetine (PAXIL) 30 MG tablet Take 30 mg by mouth every morning.   Yes Historical Provider, MD  simvastatin (ZOCOR) 10 MG tablet Take 10 mg by mouth daily.  04/23/15  Yes Historical Provider, MD  vitamin B-12 (CYANOCOBALAMIN) 1000 MCG tablet Take 1,000 mcg by mouth daily.   Yes Historical Provider, MD  loperamide (IMODIUM) 2 MG capsule Take 1 capsule (2 mg total) by mouth 4 (four) times daily as needed for diarrhea or loose stools. 02/03/16 02/07/16  Gareth Morgan, MD    Family History Family History  Problem Relation Age of Onset  . Breast cancer Mother   . Addison's disease Mother   . Dementia Mother   . Stroke Father   . Addison's disease Maternal Aunt     Social History Social History  Substance Use Topics  . Smoking status: Never Smoker  . Smokeless tobacco: Never Used  . Alcohol use No     Allergies   Codeine   Review of Systems Review of Systems  Constitutional: Positive for fatigue. Negative for fever.  HENT: Negative for sore throat.   Eyes: Negative for visual disturbance.  Respiratory: Negative for cough and shortness of breath.   Cardiovascular: Negative for chest pain.  Gastrointestinal: Positive for diarrhea. Negative for abdominal pain (denies but notes this on exam), constipation and vomiting.  Genitourinary: Negative for difficulty urinating and dysuria.  Musculoskeletal: Negative for back pain and neck pain.  Skin: Negative for rash.  Neurological: Negative for syncope and headaches.     Physical Exam Updated Vital Signs BP 139/74 (BP Location: Right Arm)   Pulse 79   Temp 97.6 F (36.4 C) (Oral)   Resp (!) 27   Ht 5\' 5"  (1.651 m)   Wt 136 lb (61.7 kg)   SpO2 100%   BMI 22.63 kg/m   Physical Exam  Constitutional: She is oriented to person, place, and time. She appears well-developed and well-nourished. No distress.  HENT:  Head:  Normocephalic and atraumatic.  Eyes: Conjunctivae and EOM are normal.  Neck: Normal range of motion.  Cardiovascular: Normal rate, regular rhythm, normal heart sounds and intact distal pulses.  Exam reveals no gallop and no friction rub.   No murmur heard. Pulmonary/Chest: Effort normal and breath sounds normal. No respiratory distress. She has no wheezes. She has no rales.  Abdominal: Soft. She exhibits no distension. There is tenderness (RLQ). There is no guarding.  Musculoskeletal: She exhibits no edema or tenderness.  Neurological: She is alert and oriented to person, place, and time.  Skin: Skin is warm and dry. No rash noted. She is not diaphoretic. No erythema.  Nursing note and vitals reviewed.    ED Treatments / Results  Labs (all  labs ordered are listed, but only abnormal results are displayed) Labs Reviewed  BASIC METABOLIC PANEL - Abnormal; Notable for the following:       Result Value   Sodium 134 (*)    CO2 21 (*)    Glucose, Bld 124 (*)    BUN 23 (*)    Creatinine, Ser 1.57 (*)    GFR calc non Af Amer 31 (*)    GFR calc Af Amer 36 (*)    All other components within normal limits  CBC - Abnormal; Notable for the following:    WBC 12.1 (*)    RBC 3.40 (*)    Hemoglobin 10.8 (*)    HCT 33.0 (*)    Platelets 412 (*)    All other components within normal limits  HEPATIC FUNCTION PANEL - Abnormal; Notable for the following:    Albumin 3.4 (*)    ALT 8 (*)    Alkaline Phosphatase 163 (*)    Bilirubin, Direct <0.1 (*)    All other components within normal limits  MAGNESIUM - Abnormal; Notable for the following:    Magnesium 1.4 (*)    All other components within normal limits  CBG MONITORING, ED - Abnormal; Notable for the following:    Glucose-Capillary 112 (*)    All other components within normal limits  I-STAT CG4 LACTIC ACID, ED - Abnormal; Notable for the following:    Lactic Acid, Venous 2.40 (*)    All other components within normal limits  C DIFFICILE  QUICK SCREEN W PCR REFLEX  URINALYSIS, ROUTINE W REFLEX MICROSCOPIC (NOT AT [facility])  I-STAT TROPOININ, ED  I-STAT TROPOININ, ED  I-STAT CG4 LACTIC ACID, ED    EKG  EKG Interpretation  Date/Time:  Tuesday February 03 2016 08:51:16 EDT Ventricular Rate:  83 PR Interval:    QRS Duration: 97 QT Interval:  391 QTC Calculation: 460 R Axis:   18 Text Interpretation:  Sinus rhythm No significant change since last tracing Confirmed by [facility] MD, Nakayla Rorabaugh ([id number]) on 02/03/2016 11:35:34 AM       Radiology Ct Abdomen Pelvis Wo Contrast  Result Date: 02/03/2016 CLINICAL DATA:  Weakness and diarrhea EXAM: CT ABDOMEN AND PELVIS WITHOUT CONTRAST TECHNIQUE: Multidetector CT imaging of the abdomen and pelvis was performed following the standard protocol without IV contrast. COMPARISON:  07/23/2010 FINDINGS: Lower chest: 6 mm nodule is noted in the right lower lobe on the first image of series 4. No other nodules are seen. This nodule is stable from a prior exam of 2012. Hepatobiliary: No mass visualized on this un-enhanced exam. Pancreas: No mass or inflammatory process identified on this un-enhanced exam. Spleen: Within normal limits in size. Adrenals/Urinary Tract: No evidence of urolithiasis or hydronephrosis. No definite mass visualized on this un-enhanced exam. Stomach/Bowel: Changes of prior Nissen fundoplication. Stomach is well distended. Mild proximal small bowel dilatation is noted without definitive transition zone or obstructive mass. The appendix is within normal limits. Mild diverticular change is seen. Vascular/Lymphatic: No aortic aneurysm is identified. Scattered small lymph nodes are noted within the mesenteric likely reactive in nature. Reproductive: Uterus has been surgically removed. Other: None. Musculoskeletal:  No suspicious bone lesions identified. IMPRESSION: Stable 6 mm nodule in the right lower lobe. No further follow-up is necessary. Mild small bowel dilatation proximally in without  definitive transition zone or obstructive mass. No other focal abnormality is noted. Electronically Signed   By: Inez Catalina M.D.   On: 02/03/2016 13:03    Procedures Procedures (  including critical care time)  Medications Ordered in ED Medications  diatrizoate meglumine-sodium (GASTROGRAFIN) 66-10 % solution 30 mL (not administered)  sodium chloride 0.9 % bolus 1,000 mL (0 mLs Intravenous Stopped 02/03/16 1243)  ondansetron (ZOFRAN) injection 4 mg (4 mg Intravenous Given 02/03/16 1012)  magnesium sulfate IVPB 1 g 100 mL (1 g Intravenous New Bag/Given 02/03/16 1439)  loperamide (IMODIUM) capsule 2 mg (2 mg Oral Given 02/03/16 1503)     Initial Impression / Assessment and Plan / ED Course  I have reviewed the triage vital signs and the nursing notes.  Pertinent labs & imaging results that were available during my care of the patient were reviewed by me and considered in my medical decision making (see chart for details).  Clinical Course   75 year old female with history of hypertension, hypertriglyceridemia, Nissen fundoplication [id number] presents with concern for diarrhea. Patient noted to have some abdominal tenderness on exam, and given recent surgery, CT abdomen and pelvis was ordered which showed no significant changes . Given recent hospitalization with diarrhea, ordered C. Difficile testing which was negative.  Labs show an elevated lactic acid of 2.4, which is likely secondary to dehydration in setting of severe diarrhea. Creatinine is similar to prior 2012. Urinalysis without UTI.  Pt also reported some SOB in triage however did not on my history, ECG without changes and troponin WNL and pt had reported some anxiety related to having diarrhea/feeling ill.  Patient was given IV fluids, and lactic acid became normal, pt feels improved. Magnesium was 1.4, and she is given IV magnesium replacement. Her vital signs remained normal.  She has no significant laboratory abnormalities, is able  to tolerate by mouth. And feel she is appropriate for continued outpatient management of diarrhea. Possible viral etiology. Give CDiff negative, feel imodium is reasonable and gave dose here and rx for same.  Recommended continued close follow-up with PCP as well as with her surgeon as scheduled. Patient discharged in stable condition with understanding of reasons to return.    Final Clinical Impressions(s) / ED Diagnoses   Final diagnoses:  Diarrhea, unspecified type  Dehydration    New Prescriptions New Prescriptions   LOPERAMIDE (IMODIUM) 2 MG CAPSULE    Take 1 capsule (2 mg total) by mouth 4 (four) times daily as needed for diarrhea or loose stools.     Gareth Morgan, MD 02/03/16 [phone]

## 2016-02-04 ENCOUNTER — Ambulatory Visit: Payer: Medicare Other | Admitting: Adult Health

## 2016-02-06 ENCOUNTER — Encounter: Payer: Self-pay | Admitting: Adult Health

## 2016-05-22 ENCOUNTER — Emergency Department (HOSPITAL_COMMUNITY)
Admission: EM | Admit: 2016-05-22 | Discharge: 2016-05-22 | Disposition: A | Discharge status: Home / Self Care | Payer: Medicare Other | Attending: Emergency Medicine | Admitting: Emergency Medicine

## 2016-05-22 ENCOUNTER — Encounter (HOSPITAL_COMMUNITY): Payer: Self-pay | Admitting: Emergency Medicine

## 2016-05-22 ENCOUNTER — Emergency Department (HOSPITAL_COMMUNITY): Payer: Medicare Other

## 2016-05-22 DIAGNOSIS — N189 Chronic kidney disease, unspecified: Secondary | ICD-10-CM | POA: Insufficient documentation

## 2016-05-22 DIAGNOSIS — R1032 Left lower quadrant pain: Secondary | ICD-10-CM | POA: Diagnosis not present

## 2016-05-22 DIAGNOSIS — R1031 Right lower quadrant pain: Secondary | ICD-10-CM | POA: Diagnosis not present

## 2016-05-22 DIAGNOSIS — Z79899 Other long term (current) drug therapy: Secondary | ICD-10-CM | POA: Diagnosis not present

## 2016-05-22 DIAGNOSIS — R110 Nausea: Secondary | ICD-10-CM | POA: Diagnosis present

## 2016-05-22 DIAGNOSIS — N83202 Unspecified ovarian cyst, left side: Secondary | ICD-10-CM | POA: Insufficient documentation

## 2016-05-22 DIAGNOSIS — Z7982 Long term (current) use of aspirin: Secondary | ICD-10-CM | POA: Insufficient documentation

## 2016-05-22 DIAGNOSIS — I129 Hypertensive chronic kidney disease with stage 1 through stage 4 chronic kidney disease, or unspecified chronic kidney disease: Secondary | ICD-10-CM | POA: Diagnosis not present

## 2016-05-22 LAB — URINALYSIS, ROUTINE W REFLEX MICROSCOPIC
BILIRUBIN URINE: NEGATIVE
GLUCOSE, UA: NEGATIVE mg/dL
HGB URINE DIPSTICK: NEGATIVE
Ketones, ur: NEGATIVE mg/dL
Nitrite: NEGATIVE
PROTEIN: NEGATIVE mg/dL
Specific Gravity, Urine: 1.013 (ref 1.005–1.030)
pH: 5.5 (ref 5.0–8.0)

## 2016-05-22 LAB — URINE MICROSCOPIC-ADD ON

## 2016-05-22 LAB — COMPREHENSIVE METABOLIC PANEL
ALBUMIN: 4.5 g/dL (ref 3.5–5.0)
ALT: 15 U/L (ref 14–54)
AST: 22 U/L (ref 15–41)
Alkaline Phosphatase: 71 U/L (ref 38–126)
Anion gap: 9 (ref 5–15)
BUN: 20 mg/dL (ref 6–20)
CHLORIDE: 103 mmol/L (ref 101–111)
CO2: 25 mmol/L (ref 22–32)
CREATININE: 1.16 mg/dL — AB (ref 0.44–1.00)
Calcium: 10.7 mg/dL — ABNORMAL HIGH (ref 8.9–10.3)
GFR calc Af Amer: 52 mL/min — ABNORMAL LOW (ref >60)
GFR calc non Af Amer: 45 mL/min — ABNORMAL LOW (ref >60)
GLUCOSE: 104 mg/dL — AB (ref 65–99)
POTASSIUM: 4 mmol/L (ref 3.5–5.1)
Sodium: 137 mmol/L (ref 135–145)
Total Bilirubin: 1.3 mg/dL — ABNORMAL HIGH (ref 0.3–1.2)
Total Protein: 8.1 g/dL (ref 6.5–8.1)

## 2016-05-22 LAB — CBC WITH DIFFERENTIAL/PLATELET
Basophils Absolute: 0 10*3/uL (ref 0.0–0.1)
Basophils Relative: 0 %
EOS ABS: 0 10*3/uL (ref 0.0–0.7)
EOS PCT: 1 %
HCT: 34.2 % — ABNORMAL LOW (ref 36.0–46.0)
Hemoglobin: 11.2 g/dL — ABNORMAL LOW (ref 12.0–15.0)
LYMPHS ABS: 1.6 10*3/uL (ref 0.7–4.0)
LYMPHS PCT: 23 %
MCH: 31.4 pg (ref 26.0–34.0)
MCHC: 32.7 g/dL (ref 30.0–36.0)
MCV: 95.8 fL (ref 78.0–100.0)
MONO ABS: 0.4 10*3/uL (ref 0.1–1.0)
MONOS PCT: 5 %
Neutro Abs: 5 10*3/uL (ref 1.7–7.7)
Neutrophils Relative %: 71 %
PLATELETS: 291 10*3/uL (ref 150–400)
RBC: 3.57 MIL/uL — AB (ref 3.87–5.11)
RDW: 12.7 % (ref 11.5–15.5)
WBC: 7 10*3/uL (ref 4.0–10.5)

## 2016-05-22 LAB — LIPASE, BLOOD: Lipase: 39 U/L (ref 11–51)

## 2016-05-22 MED ORDER — SODIUM CHLORIDE 0.9 % IJ SOLN
INTRAMUSCULAR | Status: AC
  Filled 2016-05-22: qty 50

## 2016-05-22 MED ORDER — IOPAMIDOL (ISOVUE-300) INJECTION 61%
INTRAVENOUS | Status: AC
  Administered 2016-05-22: 30 mL via ORAL
  Filled 2016-05-22: qty 30

## 2016-05-22 MED ORDER — IOPAMIDOL (ISOVUE-300) INJECTION 61%
30.0000 mL | Freq: Once | INTRAVENOUS | Status: AC | PRN
  Administered 2016-05-22: 30 mL via ORAL

## 2016-05-22 MED ORDER — SODIUM CHLORIDE 0.9 % IV BOLUS (SEPSIS)
500.0000 mL | Freq: Once | INTRAVENOUS | Status: AC
  Administered 2016-05-22: 500 mL via INTRAVENOUS

## 2016-05-22 MED ORDER — IOPAMIDOL (ISOVUE-300) INJECTION 61%
INTRAVENOUS | Status: AC
  Filled 2016-05-22: qty 75

## 2016-05-22 MED ORDER — SODIUM CHLORIDE 0.9 % IV SOLN: INTRAVENOUS | Status: DC

## 2016-05-22 MED ORDER — IOPAMIDOL (ISOVUE-300) INJECTION 61%
100.0000 mL | Freq: Once | INTRAVENOUS | Status: AC | PRN
  Administered 2016-05-22: 75 mL via INTRAVENOUS

## 2016-05-22 NOTE — ED Notes (Signed)
Patient d/c's self care.  F/U reviewed.  Patient verbalized understanding.

## 2016-05-22 NOTE — Discharge Instructions (Signed)
It is important to try eating and drinking as much as possible to keep your strength up.  CAT scan showed a cyst of the left ovary which will need further testing, with an ultrasound, to help diagnose the type of cyst. This can be ordered by your primary care doctor. Please ask your doctor to do this in the next couple of weeks.

## 2016-05-22 NOTE — ED Provider Notes (Signed)
[facility] DEPT Provider Note   CSN: [id number] Arrival date & time: 05/22/16  1259     History   Chief Complaint Chief Complaint  Patient presents with  . Hernia    HPI Darlene Ball is a 75 y.o. female.  She presents for evaluation of nausea, worsened since the fall 2 weeks ago, injuring her upper abdomen. She is worried that she has injured her postoperative area, from her recent hernia surgical repair. She has a Nissen fundoplication, July 0000000 for symptoms of nausea, abdominal pain, which started several years earlier. She was referred to surgery, a gastroenterologist. She reports improvement of symptoms following the surgical repair. She denies fever, chills, swallowing difficulty, diarrhea or constipation. She is taking her usual medications. She has been seeing a neurologist, regarding difficulty with memory, and keeping her "thoughts straight". She has been placed on a "memory pill". This distress as her, and she sometimes cries about it. There are no other no modifying factors.   HPI  Past Medical History:  Diagnosis Date  . Anxiety   . Arthritis   . Bowel obstruction   . Bursitis    hips and shoulders  . Cerebrovascular disease   . Chronic kidney disease   . Gait disturbance 10/31/2014  . GERD (gastroesophageal reflux disease)   . H/O hiatal hernia   . Hemorrhoids   . Hypertension   . Hypertriglyceridemia   . IBS (irritable bowel syndrome)   . Memory difficulty 10/31/2014  . Memory loss   . OA (osteoarthritis) of knee    right  . Panic disorder   . Scoliosis   . Tinnitus   . Tubulovillous adenoma     Patient Active Problem List   Diagnosis Date Noted  . 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 Memory difficulty 10/31/2014  . Gait disturbance 10/31/2014    Past Surgical History:  Procedure Laterality Date  . ABDOMINAL HYSTERECTOMY    . Bunionectomy Bilateral    . Catarct Surgery Bilateral    . COLONOSCOPY  WITH PROPOFOL N/A 09/11/2013   Procedure: COLONOSCOPY WITH PROPOFOL;  Surgeon: Garlan Fair, MD;  Location: [facility];  Service: Endoscopy;  Laterality: N/A;  . ESOPHAGOGASTRODUODENOSCOPY (EGD) WITH PROPOFOL N/A 09/11/2013   Procedure: ESOPHAGOGASTRODUODENOSCOPY (EGD) WITH PROPOFOL;  Surgeon: Garlan Fair, MD;  Location: [facility];  Service: Endoscopy;  Laterality: N/A;  . INSERTION OF MESH N/A 01/14/2016   Procedure: INSERTION OF MESH;  Surgeon: Ralene Ok, MD;  Location: [facility] ORS;  Service: General;  Laterality: N/A;  . ROTATOR CUFF REPAIR      OB History    No data available       Home Medications    Prior to Admission medications   Medication Sig Start Date End Date Taking? Authorizing Provider  acetaminophen (TYLENOL) 325 MG tablet Take 650 mg by mouth every 6 (six) hours as needed for mild pain.   Yes Historical Provider, MD  aspirin 81 MG tablet Take 81 mg by mouth every evening.    Yes Historical Provider, MD  donepezil (ARICEPT) 10 MG tablet Take 1 tablet (10 mg total) by mouth at bedtime. 08/07/15  Yes Ward Givens, NP  fluticasone (FLONASE) 50 MCG/ACT nasal spray Place 2 sprays into both nostrils daily as needed for allergies.    Yes Historical Provider, MD  lisinopril-hydrochlorothiazide (PRINZIDE,ZESTORETIC) 20-12.5 MG per tablet Take 1 tablet by mouth every morning.   Yes Historical Provider, MD  megestrol (MEGACE) 20 MG tablet Take 20  mg by mouth 2 (two) times daily. 05/09/16  Yes Historical Provider, MD  metoprolol tartrate (LOPRESSOR) 25 MG tablet Take 12.5 mg by mouth 2 (two) times daily.   Yes Historical Provider, MD  Multiple Vitamin (MULTIVITAMIN WITH MINERALS) TABS tablet Take 1 tablet by mouth daily.   Yes Historical Provider, MD  Multiple Vitamins-Minerals (HAIR SKIN NAILS PO) Take 1 tablet by mouth daily.   Yes Historical Provider, MD  PARoxetine (PAXIL) 30 MG tablet Take 30 mg by mouth every morning.   Yes Historical Provider, MD  simvastatin  (ZOCOR) 10 MG tablet Take 10 mg by mouth daily.  04/23/15  Yes Historical Provider, MD  clonazePAM (KLONOPIN) 1 MG tablet Take 1-1.5 mg by mouth 2 (two) times daily. Pt is to take 1mg  every morning and 1.5mg  at bedtime    Historical Provider, MD  HYDROcodone-acetaminophen (HYCET) 7.5-325 mg/15 ml solution Take 10 mLs by mouth every 4 (four) hours as needed for moderate pain. Patient not taking: Reported on 05/22/2016 01/16/16   Ralene Ok, MD    Family History Family History  Problem Relation Age of Onset  . Breast cancer Mother   . Addison's disease Mother   . Dementia Mother   . Stroke Father   . Addison's disease Maternal Aunt     Social History Social History  Substance Use Topics  . Smoking status: Never Smoker  . Smokeless tobacco: Never Used  . Alcohol use No     Allergies   Codeine   Review of Systems Review of Systems  All other systems reviewed and are negative.    Physical Exam Updated Vital Signs BP 157/63 (BP Location: Left Arm)   Pulse 82   Temp 98.1 F (36.7 C) (Oral)   Resp 16   Ht 5\' 5"  (1.651 m)   Wt 125 lb 6.4 oz (56.9 kg)   SpO2 100%   BMI 20.87 kg/m   Physical Exam  Constitutional: She is oriented to person, place, and time. She appears well-developed. No distress.  Elderly, frail  HENT:  Head: Normocephalic and atraumatic.  Eyes: Conjunctivae and EOM are normal. Pupils are equal, round, and reactive to light.  Neck: Normal range of motion and phonation normal. Neck supple.  Cardiovascular: Normal rate and regular rhythm.   Pulmonary/Chest: Effort normal and breath sounds normal. She exhibits no tenderness.  Abdominal: Soft. She exhibits no distension and no mass. There is tenderness (Lower quadrants bilaterally, mild). There is no rebound and no guarding. No hernia.  No palpable hernia. Well-healed laparoscopic scars are present on the abdominal wall.  Musculoskeletal: Normal range of motion.  Neurological: She is alert and oriented  to person, place, and time. She exhibits normal muscle tone.  Skin: Skin is warm and dry.  Psychiatric: She has a normal mood and affect. Her behavior is normal.  Nursing note and vitals reviewed.    ED Treatments / Results  Labs (all labs ordered are listed, but only abnormal results are displayed) Labs Reviewed  COMPREHENSIVE METABOLIC PANEL - Abnormal; Notable for the following:       Result Value   Glucose, Bld 104 (*)    Creatinine, Ser 1.16 (*)    Calcium 10.7 (*)    Total Bilirubin 1.3 (*)    GFR calc non Af Amer 45 (*)    GFR calc Af Amer 52 (*)    All other components within normal limits  CBC WITH DIFFERENTIAL/PLATELET - Abnormal; Notable for the following:    RBC 3.57 (*)  Hemoglobin 11.2 (*)    HCT 34.2 (*)    All other components within normal limits  URINALYSIS, ROUTINE W REFLEX MICROSCOPIC (NOT AT [facility]) - Abnormal; Notable for the following:    Leukocytes, UA MODERATE (*)    All other components within normal limits  URINE MICROSCOPIC-ADD ON - Abnormal; Notable for the following:    Squamous Epithelial / LPF 0-5 (*)    Bacteria, UA FEW (*)    All other components within normal limits  LIPASE, BLOOD    EKG  EKG Interpretation None       Radiology Ct Abdomen Pelvis W Contrast  Result Date: 05/22/2016 CLINICAL DATA:  75 year old female with history of intermittent nausea since July when she had surgery for a hiatal hernia. Multiple recent falls. EXAM: CT ABDOMEN AND PELVIS WITH CONTRAST TECHNIQUE: Multidetector CT imaging of the abdomen and pelvis was performed using the standard protocol following bolus administration of intravenous contrast. CONTRAST:  65mL ISOVUE-300 IOPAMIDOL (ISOVUE-300) INJECTION 61% COMPARISON:  CT the abdomen and pelvis 02/03/2016. FINDINGS: Lower chest: Aortic atherosclerosis. Calcified atherosclerotic plaque in the left anterior descending, left circumflex and right coronary arteries. Areas of increasing septal thickening are noted  throughout the periphery of the visualized lung bases when compared to prior study 02/03/2016, concerning for developing interstitial lung disease. Hepatobiliary: No cystic or solid hepatic lesions. No intra or extrahepatic biliary ductal dilatation. Tiny calcified gallstone near the neck of the gallbladder. Gallbladder is otherwise unremarkable in appearance. Pancreas: No pancreatic mass. No pancreatic ductal dilatation. No pancreatic or peripancreatic fluid or inflammatory changes. Spleen: Unremarkable. Adrenals/Urinary Tract: Mild diffuse cortical atrophy in the kidneys bilaterally. Sub cm low-attenuation lesion in the interpolar region of the right kidney is too small to definitively characterize, but is statistically likely a tiny cyst. No hydroureteronephrosis. Urinary bladder is normal in appearance. Bilateral adrenal glands are normal in appearance. Stomach/Bowel: Postoperative changes of prior Nissen fundoplication are noted. More distal aspects of the stomach are otherwise normal in appearance. No pathologic dilatation of small bowel or colon. Normal appendix. Vascular/Lymphatic: Aortic atherosclerosis, without evidence of aneurysm or dissection in the abdominal or pelvic vasculature. No lymphadenopathy noted in the abdomen or pelvis. Reproductive: Status post hysterectomy. Right ovary is atrophic. 3.5 x 2.2 x 2.5 cm low-attenuation lesion in the left adnexa is presumably ovarian, and is similar in size to prior study 02/03/2016, likely a cyst. Other: No significant volume of ascites.  No pneumoperitoneum. Musculoskeletal: There are no aggressive appearing lytic or blastic lesions noted in the visualized portions of the skeleton. IMPRESSION: 1. No acute findings in the abdomen or pelvis to account for the patient's symptoms. 2. Postoperative changes of Nissen fundoplication without evidence of recurrent herniation. 3. **An incidental finding of potential clinical significance has been found. 3.5 x 2.2 x  2.5 cm left ovarian cystic lesion. In a postmenopausal female, further evaluation with nonemergent pelvic ultrasound is strongly recommended in the near future to ensure the benign nature of this finding. This recommendation follows ACR consensus guidelines: White Paper of the ACR Incidental Findings Committee II on Adnexal Findings. J Am Coll Radiol [phone].** 4. Cholelithiasis without evidence of acute cholecystitis at this time. 5. Mild diffuse cortical atrophy in the kidneys bilaterally. 6. Aortic atherosclerosis, in addition to at least 3 vessel coronary artery disease. Assessment for potential risk factor modification, dietary therapy or pharmacologic therapy may be warranted, if clinically indicated. Electronically Signed   By: Vinnie Langton M.D.   On: 05/22/2016 18:02  Procedures Procedures (including critical care time)  Medications Ordered in ED Medications  0.9 %  sodium chloride infusion (not administered)  iopamidol (ISOVUE-300) 61 % injection (not administered)  sodium chloride 0.9 % injection (not administered)  sodium chloride 0.9 % bolus 500 mL (0 mLs Intravenous Stopped 05/22/16 1826)  iopamidol (ISOVUE-300) 61 % injection 30 mL (30 mLs Oral Contrast Given 05/22/16 1653)  iopamidol (ISOVUE-300) 61 % injection 100 mL (75 mLs Intravenous Contrast Given 05/22/16 1731)     Initial Impression / Assessment and Plan / ED Course  I have reviewed the triage vital signs and the nursing notes.  Pertinent labs & imaging results that were available during my care of the patient were reviewed by me and considered in my medical decision making (see chart for details).  Clinical Course     Medications  0.9 %  sodium chloride infusion (not administered)  iopamidol (ISOVUE-300) 61 % injection (not administered)  sodium chloride 0.9 % injection (not administered)  sodium chloride 0.9 % bolus 500 mL (0 mLs Intravenous Stopped 05/22/16 1826)  iopamidol (ISOVUE-300) 61 % injection  30 mL (30 mLs Oral Contrast Given 05/22/16 1653)  iopamidol (ISOVUE-300) 61 % injection 100 mL (75 mLs Intravenous Contrast Given 05/22/16 1731)    Patient Vitals for the past 24 hrs:  BP Temp Temp src Pulse Resp SpO2 Height Weight  05/22/16 1759 157/63 - - 82 16 100 % - -  05/22/16 1527 136/57 - - 73 18 100 % - -  05/22/16 1308 144/67 98.1 F (36.7 C) Oral 86 16 100 % 5\' 5"  (1.651 m) 125 lb 6.4 oz (56.9 kg)    6:30 PM Reevaluation with update and discussion. After initial assessment and treatment, an updated evaluation reveals No change in clinical status. She is comfortable. Findings discussed with patient and husband, all questions answered. Michaelpaul Apo L    Final Clinical Impressions(s) / ED Diagnoses   Final diagnoses:  Nausea  Left ovarian cyst    Nausea, without clear cause. Screening evaluation with CT scan did not show complication from prior surgery, or other causes for nausea. There was an incidental note made of a left ovarian cyst, which will require further evaluation. The patient and her husband were made aware of this, verbally, and in writing. Doubt serious bacterial infection, metabolic instability or impending vascular collapse.  Nursing Notes Reviewed/ Care Coordinated Applicable Imaging Reviewed Interpretation of Laboratory Data incorporated into ED treatment  The patient appears reasonably screened and/or stabilized for discharge and I doubt any other medical condition or other [facility] requiring further screening, evaluation, or treatment in the ED at this time prior to discharge.  Plan: Home Medications- continue; Home Treatments- rest; return here if the recommended treatment, does not improve the symptoms; Recommended follow up- PCP 1 week, schedule OP U/S pelvis to evaluate left ovarian cyst in a post-menopausal female.   New Prescriptions New Prescriptions   No medications on file     Daleen Bo, MD 05/22/16 1836

## 2016-05-22 NOTE — ED Notes (Signed)
ED Provider at bedside. 

## 2016-05-22 NOTE — ED Triage Notes (Signed)
Per pt, states she had surgery for a hiatal hernia in July-states since then she has been having nausea on and off-states she has had recent falls-states she thinks she has injured site where hernia was-states she is also tapering off of Klonopin

## 2016-05-31 ENCOUNTER — Telehealth: Payer: Self-pay | Admitting: *Deleted

## 2016-05-31 ENCOUNTER — Ambulatory Visit (INDEPENDENT_AMBULATORY_CARE_PROVIDER_SITE_OTHER): Payer: Medicare Other | Admitting: Adult Health

## 2016-05-31 ENCOUNTER — Encounter: Payer: Self-pay | Admitting: Adult Health

## 2016-05-31 VITALS — BP 123/62 | HR 86 | Ht 65.0 in | Wt 128.0 lb

## 2016-05-31 DIAGNOSIS — R413 Other amnesia: Secondary | ICD-10-CM | POA: Diagnosis not present

## 2016-05-31 DIAGNOSIS — R4689 Other symptoms and signs involving appearance and behavior: Secondary | ICD-10-CM

## 2016-05-31 DIAGNOSIS — F419 Anxiety disorder, unspecified: Secondary | ICD-10-CM

## 2016-05-31 MED ORDER — MEMANTINE HCL 5 MG PO TABS
ORAL_TABLET | ORAL | 1 refills | Status: DC
Start: 2016-05-31 — End: 2016-08-02

## 2016-05-31 NOTE — Telephone Encounter (Signed)
Granddaughter, Anderson Malta called stating she wanted Jinny Blossom to be made aware of her most worrisome concern over her grand mother.  She stated she has become "violent with my grandfather". She stated she did not want to say this in front of her grandmother; follow up is this afternoon. Anderson Malta will be accompanying patient today.

## 2016-05-31 NOTE — Progress Notes (Signed)
PATIENT: Darlene Ball DOB: [birth year]  REASON FOR VISIT: follow up- memory disturbance HISTORY FROM: patient  HISTORY OF PRESENT ILLNESS: Today 05/31/2016:  Darlene Ball is a 75 she'll feel with a history of memory disturbance. She returns today for follow-up. In the past she's been on Aricept however the granddaughter is with her today and reports that this caused vivid dreams so the patient has not been taking it. Reports that the patient followed up with her PCP at that time she was taken off of Klonopin. Reports that she has been on this medication for over 15 years. Granddaughter reports that after she was taken off this medication they noticed some change in her mood and agitation. The granddaughter even called in  before that visit to make [place] aware that she had been lashing out at her grandfather. The patient also acknowledges that she has high anxiety. She lives at home with her husband. She does need some assistance with ADLs. She no longer prepares any meals. She recently had to give up her finances because she was not paying all the bills on time. She states that she also is having a difficult time sleeping since Klonopin was discontinued. Husband reports that she woke up at 4 AM having a panic attack. Reports that she did call her primary care who restarted Klonopin. Patient returns today for an evaluation.  HISTORY 08/07/15: Darlene. Ball is a 75 year old female with a history of memory disturbance. She returns today for follow-up. The patient has been taking Aricept 5 mg daily. She states she tolerates this medication well. She is able to complete all ADLs independently. She does not operate a motor vehicle mainly because her husband likes to drive. The patient will cook occasionally but states that she really reduced her cooking when she retired. She denies having to give up doing anything due to her memory. She denies any new neurological symptoms. She returns today for an  evaluation.  HISTORY  05/06/15: Darlene Ball is a 75 year old female with a history of memory disturbance. She returns today for follow-up. Overall the patient feels that her memory has remained the same. She states that she does have a hard time remembering the date. She is able to complete all ADLs independently. However she does ask her husband for assistance getting in and out of the bathtub. She completes all of the finances however recently she has had some late payments. She no longer cooks meals anymore since they retired. They typically go out to eat. She no longer operates a motor vehicle. Patient feels that her balance is still a little off however she denies any falls. She does not use any assistive device when ambulate. She denies any new neurological symptoms. She returns today for an evaluation.   REVIEW OF SYSTEMS: Out of a complete 14 system review of symptoms, the patient complains only of the following symptoms, and all other reviewed systems are negative.  Runny nose, chills, activity change, blurred vision, black stools, cold intolerance, back pain, walking difficulty, confusion, depression, nervous/anxious, weakness, dizziness, memory loss  ALLERGIES: Allergies  Allergen Reactions  . Codeine Nausea Only    HOME MEDICATIONS: Outpatient Medications Prior to Visit  Medication Sig Dispense Refill  . acetaminophen (TYLENOL) 325 MG tablet Take 650 mg by mouth every 6 (six) hours as needed for mild pain.    Marland Kitchen aspirin 81 MG tablet Take 81 mg by mouth every evening.     . clonazePAM (KLONOPIN) 1 MG tablet Take  1-1.5 mg by mouth 2 (two) times daily as needed. Pt is to take 1mg  every morning and 1.5mg  at bedtime    . fluticasone (FLONASE) 50 MCG/ACT nasal spray Place 2 sprays into both nostrils daily as needed for allergies.     Marland Kitchen lisinopril-hydrochlorothiazide (PRINZIDE,ZESTORETIC) 20-12.5 MG per tablet Take 1 tablet by mouth every morning.    . megestrol (MEGACE) 20 MG tablet  Take 20 mg by mouth 2 (two) times daily.  2  . metoprolol tartrate (LOPRESSOR) 25 MG tablet Take 12.5 mg by mouth 2 (two) times daily.    . Multiple Vitamin (MULTIVITAMIN WITH MINERALS) TABS tablet Take 1 tablet by mouth daily.    . Multiple Vitamins-Minerals (HAIR SKIN NAILS PO) Take 1 tablet by mouth daily.    Marland Kitchen PARoxetine (PAXIL) 30 MG tablet Take 30 mg by mouth every morning.    . simvastatin (ZOCOR) 10 MG tablet Take 10 mg by mouth daily.   6  . donepezil (ARICEPT) 10 MG tablet Take 1 tablet (10 mg total) by mouth at bedtime. (Patient not taking: Reported on 05/31/2016) 30 tablet 11  . HYDROcodone-acetaminophen (HYCET) 7.5-325 mg/15 ml solution Take 10 mLs by mouth every 4 (four) hours as needed for moderate pain. (Patient not taking: Reported on 05/22/2016) 120 mL 0   No facility-administered medications prior to visit.     PAST MEDICAL HISTORY: Past Medical History:  Diagnosis Date  . Anxiety   . Arthritis   . Bowel obstruction   . Bursitis    hips and shoulders  . Cerebrovascular disease   . Chronic kidney disease   . Gait disturbance 10/31/2014  . GERD (gastroesophageal reflux disease)   . H/O hiatal hernia   . Hemorrhoids   . Hypertension   . Hypertriglyceridemia   . IBS (irritable bowel syndrome)   . Memory difficulty 10/31/2014  . Memory loss   . OA (osteoarthritis) of knee    right  . Panic disorder   . Scoliosis   . Tinnitus   . Tubulovillous adenoma     PAST SURGICAL HISTORY: Past Surgical History:  Procedure Laterality Date  . ABDOMINAL HYSTERECTOMY    . Bunionectomy Bilateral    . Catarct Surgery Bilateral    . COLONOSCOPY WITH PROPOFOL N/A 09/11/2013   Procedure: COLONOSCOPY WITH PROPOFOL;  Surgeon: Garlan Fair, MD;  Location: [facility];  Service: Endoscopy;  Laterality: N/A;  . ESOPHAGOGASTRODUODENOSCOPY (EGD) WITH PROPOFOL N/A 09/11/2013   Procedure: ESOPHAGOGASTRODUODENOSCOPY (EGD) WITH PROPOFOL;  Surgeon: Garlan Fair, MD;  Location: [facility];  Service: Endoscopy;  Laterality: N/A;  . INSERTION OF MESH N/A 01/14/2016   Procedure: INSERTION OF MESH;  Surgeon: Ralene Ok, MD;  Location: [facility];  Service: General;  Laterality: N/A;  . ROTATOR CUFF REPAIR      FAMILY HISTORY: Family History  Problem Relation Age of Onset  . Breast cancer Mother   . Addison's disease Mother   . Dementia Mother   . Stroke Father   . Addison's disease Maternal Aunt     SOCIAL HISTORY: Social History   Social History  . Marital status: Married    Spouse name: N/A  . Number of children: 1  . Years of education: 60   Occupational History  . retired    Social History Main Topics  . Smoking status: Never Smoker  . Smokeless tobacco: Never Used  . Alcohol use No  . Drug use: No  . Sexual activity: Not on file  Other Topics Concern  . Not on file   Social History Narrative   Patient drinks caffeine occasionally.   Patient is left handed.      PHYSICAL EXAM  Vitals:   05/31/16 1310  BP: 123/62  Pulse: 86  Weight: 128 lb (58.1 kg)  Height: 5\' 5"  (1.651 m)   Body mass index is 21.3 kg/m.   MMSE - Mini Mental State Exam 05/31/2016 08/07/2015 05/06/2015  Orientation to time 2 4 4   Orientation to Place 5 5 5   Registration 3 3 3   Attention/ Calculation 3 5 5   Recall 0 2 3  Language- name 2 objects 2 2 2   Language- repeat 1 1 1   Language- follow 3 step command 3 3 3   Language- read & follow direction 1 1 1   Write a sentence 1 1 1   Copy design 1 1 1   Total score 22 28 29      Generalized: Well developed, in no acute distress   Neurological examination  Mentation: Alert. Follows all commands speech and language fluent Cranial nerve II-XII: Pupils were equal round reactive to light. Extraocular movements were full, visual field were full on confrontational test. Facial sensation and strength were normal. Uvula tongue midline. Head turning and shoulder shrug  were normal and symmetric. Motor: The motor  testing reveals 5 over 5 strength of all 4 extremities. Good symmetric motor tone is noted throughout.  Sensory: Sensory testing is intact to soft touch on all 4 extremities. No evidence of extinction is noted.  Coordination: Cerebellar testing reveals good finger-nose-finger and heel-to-shin bilaterally.  Gait and station: Gait is normal. Tandem gait is slightly unsteady. Romberg is negative. No drift is seen.  Reflexes: Deep tendon reflexes are symmetric and normal bilaterally.   DIAGNOSTIC DATA (LABS, IMAGING, TESTING) - I reviewed patient records, labs, notes, testing and imaging myself where available.  Lab Results  Component Value Date   WBC 7.0 05/22/2016   HGB 11.2 (L) 05/22/2016   HCT 34.2 (L) 05/22/2016   MCV 95.8 05/22/2016   PLT 291 05/22/2016      Component Value Date/Time   NA 137 05/22/2016 1611   K 4.0 05/22/2016 1611   CL 103 05/22/2016 1611   CO2 25 05/22/2016 1611   GLUCOSE 104 (H) 05/22/2016 1611   BUN 20 05/22/2016 1611   CREATININE 1.16 (H) 05/22/2016 1611   CALCIUM 10.7 (H) 05/22/2016 1611   PROT 8.1 05/22/2016 1611   ALBUMIN 4.5 05/22/2016 1611   AST 22 05/22/2016 1611   ALT 15 05/22/2016 1611   ALKPHOS 71 05/22/2016 1611   BILITOT 1.3 (H) 05/22/2016 1611   GFRNONAA 45 (L) 05/22/2016 1611   GFRAA 52 (L) 05/22/2016 1611      ASSESSMENT AND PLAN 75 y.o. year old female  has a past medical history of Anxiety; Arthritis; Bowel obstruction; Bursitis; Cerebrovascular disease; Chronic kidney disease; Gait disturbance (10/31/2014); GERD (gastroesophageal reflux disease); H/O hiatal hernia; Hemorrhoids; Hypertension; Hypertriglyceridemia; IBS (irritable bowel syndrome); Memory difficulty (10/31/2014); Memory loss; OA (osteoarthritis) of knee; Panic disorder; Scoliosis; Tinnitus; and Tubulovillous adenoma. here with:  1. Memory disturbance 2. Anxiety  3. Behavior change  The patient's memory score has slightly declined since the last visit. She was unable to  tolerate Aricept and for that reason we will start on Namenda. She will begin by taking 5 mg for 1 week then increasing to 5 mg twice a day. The patient's behavioral disturbance could be due to the discontinuation of Klonopin. I advised that  if her behavior did not improve when she restarts Klonopin then should she let us know. The patient will follow-up in 2 months or sooner if needed.     Ward Givens, MSN, NP-C 05/31/2016, 1:23 PM [facility] [address], [place], [place] [address] [phone]

## 2016-05-31 NOTE — Patient Instructions (Signed)
Stop Aricept  Start Namenda 5 mg- 1 tablet daily for one week then increase to 1 tablet twice a day Use Klonopin for anxiety- particularly at bedtime If your symptoms worsen or you develop new symptoms please let us know.

## 2016-06-10 ENCOUNTER — Telehealth: Payer: Self-pay | Admitting: Adult Health

## 2016-06-10 NOTE — Telephone Encounter (Signed)
Patients daughter Darlene Ball calling in stating patient was prescribed Menantine 5mg  at her last visit.  Patient has been having nausea and severe night sweats. Please call.

## 2016-06-10 NOTE — Telephone Encounter (Signed)
Per Edman Circle, NP, left VM for Suanne Marker, daughter and advised her mother may continue on 5 mg dose for another week to see if the side effects resolve as her body adjusts. Advised, however  that if she cannot tolerate the nausea and night sweats she may stop the medication.  Other alternatives can be discussed at her next appointment. Left number for any questions.

## 2016-06-10 NOTE — Telephone Encounter (Signed)
She can stay at the 5 mg dose another week to see if symptoms improve. However if she is unable to tolerate the nausea and night sweats she can stop the medication.

## 2016-08-02 ENCOUNTER — Encounter: Payer: Self-pay | Admitting: Adult Health

## 2016-08-02 ENCOUNTER — Ambulatory Visit (INDEPENDENT_AMBULATORY_CARE_PROVIDER_SITE_OTHER): Payer: Medicare Other | Admitting: Adult Health

## 2016-08-02 VITALS — BP 141/65 | HR 70 | Ht 65.0 in | Wt 132.8 lb

## 2016-08-02 DIAGNOSIS — R413 Other amnesia: Secondary | ICD-10-CM | POA: Diagnosis not present

## 2016-08-02 MED ORDER — DONEPEZIL HCL 5 MG PO TABS: 5.0000 mg | ORAL_TABLET | Freq: Every day | ORAL | 11 refills | Status: AC

## 2016-08-02 NOTE — Patient Instructions (Signed)
Restart Aricept 5 mg daily. If you are unable to tolerate please let us know If your symptoms worsen or you develop new symptoms please let us know.

## 2016-08-02 NOTE — Progress Notes (Signed)
PATIENT: Darlene Ball DOB: [birth year]  REASON FOR VISIT: follow up- memory disturbance HISTORY FROM: patient  HISTORY OF PRESENT ILLNESS: Darlene Ball is a 76 year old female with a history of memory disturbance. She returns today for follow-up. In the past she's been on Aricept however could not tolerate this due to vivid dreams. She was placed on Namenda at the last visit. She reports that it caused severe nausea so she stopped the medication. The patient lives at home with her husband. She does require some assistance with ADLs. She does not operate a motor vehicle. Her husband handles the finances and prepares all meals. Patient is sleeping okay at night. Agitation and aggressiveness has improved since she restarted Klonopin. She returns today for an evaluation.  HISTORY 05/31/16:  Darlene Ball is a 65 she'll feel with a history of memory disturbance. She returns today for follow-up. In the past she's been on Aricept however the granddaughter is with her today and reports that this caused vivid dreams so the patient has not been taking it. Reports that the patient followed up with her PCP at that time she was taken off of Klonopin. Reports that she has been on this medication for over 15 years. Granddaughter reports that after she was taken off this medication they noticed some change in her mood and agitation. The granddaughter even called in  before that visit to make [place] aware that she had been lashing out at her grandfather. The patient also acknowledges that she has high anxiety. She lives at home with her husband. She does need some assistance with ADLs. She no longer prepares any meals. She recently had to give up her finances because she was not paying all the bills on time. She states that she also is having a difficult time sleeping since Klonopin was discontinued. Husband reports that she woke up at 4 AM having a panic attack. Reports that she did call her primary care who restarted  Klonopin. Patient returns today for an evaluation.  HISTORY 08/07/15: Darlene Ball is a 76 year old female with a history of memory disturbance. She returns today for follow-up. The patient has been taking Aricept 5 mg daily. She states she tolerates this medication well. She is able to complete all ADLs independently. She does not operate a motor vehicle mainly because her husband likes to drive. The patient will cook occasionally but states that she really reduced her cooking when she retired. She denies having to give up doing anything due to her memory. She denies any new neurological symptoms. She returns today for an evaluation.  HISTORY 05/06/15: Darlene Ball is a 76 year old female with a history of memory disturbance. She returns today for follow-up. Overall the patient feels that her memory has remained the same. She states that she does have a hard time remembering the date. She is able to complete all ADLs independently. However she does ask her husband for assistance getting in and out of the bathtub. She completes all of the finances however recently she has had some late payments. She no longer cooks meals anymore since they retired. They typically go out to eat. She no longer operates a motor vehicle. Patient feels that her balance is still a little off however she denies any falls. She does not use any assistive device when ambulate. She denies any new neurological symptoms. She returns today for an evaluation.   REVIEW OF SYSTEMS: Out of a complete 14 system review of symptoms, the patient complains only of  the following symptoms, and all other reviewed systems are negative.  Back pain, walking difficulty, confusion, depression, memory loss, cold intolerance, blurred vision, eye discharge, runny nose  ALLERGIES: Allergies  Allergen Reactions  . Codeine Nausea Only    HOME MEDICATIONS: Outpatient Medications Prior to Visit  Medication Sig Dispense Refill  . acetaminophen  (TYLENOL) 325 MG tablet Take 650 mg by mouth every 6 (six) hours as needed for mild pain.    Marland Kitchen aspirin 81 MG tablet Take 81 mg by mouth every evening.     . clonazePAM (KLONOPIN) 1 MG tablet Take 1-1.5 mg by mouth 2 (two) times daily as needed. Pt is to take 1mg  every morning and 1.5mg  at bedtime    . fluticasone (FLONASE) 50 MCG/ACT nasal spray Place 2 sprays into both nostrils daily as needed for allergies.     Marland Kitchen lisinopril-hydrochlorothiazide (PRINZIDE,ZESTORETIC) 20-12.5 MG per tablet Take 1 tablet by mouth every morning.    . megestrol (MEGACE) 20 MG tablet Take 20 mg by mouth 2 (two) times daily.  2  . metoprolol tartrate (LOPRESSOR) 25 MG tablet Take 12.5 mg by mouth 2 (two) times daily.    . Multiple Vitamin (MULTIVITAMIN WITH MINERALS) TABS tablet Take 1 tablet by mouth daily.    . Multiple Vitamins-Minerals (HAIR SKIN NAILS PO) Take 1 tablet by mouth daily.    Marland Kitchen PARoxetine (PAXIL) 30 MG tablet Take 30 mg by mouth every morning.    . simvastatin (ZOCOR) 10 MG tablet Take 10 mg by mouth daily.   6  . memantine (NAMENDA) 5 MG tablet Take 1 PO daily for one week then increase to 1 tablet twice a day (Patient not taking: Reported on 08/02/2016) 60 tablet 1   No facility-administered medications prior to visit.     PAST MEDICAL HISTORY: Past Medical History:  Diagnosis Date  . Anxiety   . Arthritis   . Bowel obstruction   . Bursitis    hips and shoulders  . Cerebrovascular disease   . Chronic kidney disease   . Gait disturbance 10/31/2014  . GERD (gastroesophageal reflux disease)   . H/O hiatal hernia   . Hemorrhoids   . Hypertension   . Hypertriglyceridemia   . IBS (irritable bowel syndrome)   . Memory difficulty 10/31/2014  . Memory loss   . OA (osteoarthritis) of knee    right  . Panic disorder   . Scoliosis   . Tinnitus   . Tubulovillous adenoma     PAST SURGICAL HISTORY: Past Surgical History:  Procedure Laterality Date  . ABDOMINAL HYSTERECTOMY    . Bunionectomy  Bilateral    . Catarct Surgery Bilateral    . COLONOSCOPY WITH PROPOFOL N/A 09/11/2013   Procedure: COLONOSCOPY WITH PROPOFOL;  Surgeon: Garlan Fair, MD;  Location: [facility];  Service: Endoscopy;  Laterality: N/A;  . ESOPHAGOGASTRODUODENOSCOPY (EGD) WITH PROPOFOL N/A 09/11/2013   Procedure: ESOPHAGOGASTRODUODENOSCOPY (EGD) WITH PROPOFOL;  Surgeon: Garlan Fair, MD;  Location: [facility];  Service: Endoscopy;  Laterality: N/A;  . INSERTION OF MESH N/A 01/14/2016   Procedure: INSERTION OF MESH;  Surgeon: Ralene Ok, MD;  Location: [facility];  Service: General;  Laterality: N/A;  . ROTATOR CUFF REPAIR      FAMILY HISTORY: Family History  Problem Relation Age of Onset  . Breast cancer Mother   . Addison's disease Mother   . Dementia Mother   . Stroke Father   . Addison's disease Maternal Aunt     SOCIAL HISTORY:  Social History   Social History  . Marital status: Married    Spouse name: N/A  . Number of children: 1  . Years of education: 30   Occupational History  . retired    Social History Main Topics  . Smoking status: Never Smoker  . Smokeless tobacco: Never Used  . Alcohol use No  . Drug use: No  . Sexual activity: Not on file   Other Topics Concern  . Not on file   Social History Narrative   Patient drinks caffeine occasionally.   Patient is left handed.      PHYSICAL EXAM  Vitals:   08/02/16 1417  Weight: 132 lb 12.8 oz (60.2 kg)  Height: 5\' 5"  (1.651 m)   Body mass index is 22.1 kg/m.  MMSE - Mini Mental State Exam 08/02/2016 05/31/2016 08/07/2015  Orientation to time 5 2 4   Orientation to Place 4 5 5   Registration 3 3 3   Attention/ Calculation 4 3 5   Recall 1 0 2  Language- name 2 objects 2 2 2   Language- repeat 1 1 1   Language- follow 3 step command 3 3 3   Language- read & follow direction 1 1 1   Write a sentence 1 1 1   Copy design 1 1 1   Total score 26 22 28      Generalized: Well developed, in no acute distress    Neurological examination  Mentation: Alert oriented to time, place, history taking. Follows all commands speech and language fluent Cranial nerve II-XII: Pupils were equal round reactive to light. Extraocular movements were full, visual field were full on confrontational test. Facial sensation and strength were normal. Uvula tongue midline. Head turning and shoulder shrug  were normal and symmetric. Motor: The motor testing reveals 5 over 5 strength of all 4 extremities. Good symmetric motor tone is noted throughout.  Sensory: Sensory testing is intact to soft touch on all 4 extremities. No evidence of extinction is noted.  Coordination: Cerebellar testing reveals good finger-nose-finger and heel-to-shin bilaterally.  Gait and station: Gait is normal. Reflexes: Deep tendon reflexes are symmetric and normal bilaterally.   DIAGNOSTIC DATA (LABS, IMAGING, TESTING) - I reviewed patient records, labs, notes, testing and imaging myself where available.  Lab Results  Component Value Date   WBC 7.0 05/22/2016   HGB 11.2 (L) 05/22/2016   HCT 34.2 (L) 05/22/2016   MCV 95.8 05/22/2016   PLT 291 05/22/2016      Component Value Date/Time   NA 137 05/22/2016 1611   K 4.0 05/22/2016 1611   CL 103 05/22/2016 1611   CO2 25 05/22/2016 1611   GLUCOSE 104 (H) 05/22/2016 1611   BUN 20 05/22/2016 1611   CREATININE 1.16 (H) 05/22/2016 1611   CALCIUM 10.7 (H) 05/22/2016 1611   PROT 8.1 05/22/2016 1611   ALBUMIN 4.5 05/22/2016 1611   AST 22 05/22/2016 1611   ALT 15 05/22/2016 1611   ALKPHOS 71 05/22/2016 1611   BILITOT 1.3 (H) 05/22/2016 1611   GFRNONAA 45 (L) 05/22/2016 1611   GFRAA 52 (L) 05/22/2016 1611      ASSESSMENT AND PLAN 76 y.o. year old female  has a past medical history of Anxiety; Arthritis; Bowel obstruction; Bursitis; Cerebrovascular disease; Chronic kidney disease; Gait disturbance (10/31/2014); GERD (gastroesophageal reflux disease); H/O hiatal hernia; Hemorrhoids; Hypertension;  Hypertriglyceridemia; IBS (irritable bowel syndrome); Memory difficulty (10/31/2014); Memory loss; OA (osteoarthritis) of knee; Panic disorder; Scoliosis; Tinnitus; and Tubulovillous adenoma. here with:  1. Memory disturbance  The patient's memory  score has improved since the last visit. The patient would like to retry Aricept. She will start Aricept at 5 mg taking it in the mornings. If she is unable to tolerate this she should let us know. She has improved since she restarted Klonopin. She has a follow-up appointment with a psychiatrist. Advised that if her symptoms worsen or she develops new symptoms she should let us know. Will follow-up in 6 months or sooner if needed.  I spent 15 minutes with the patient 50% of this time was spent reviewing Aricept and potential side effects.   Ward Givens, MSN, NP-C 08/02/2016, 2:31 PM [facility] [address] [place], [place] [address] [phone]

## 2016-08-02 NOTE — Progress Notes (Signed)
I have read the note, and I agree with the clinical assessment and plan.  WILLIS,CHARLES KEITH   

## 2016-10-20 ENCOUNTER — Encounter (HOSPITAL_COMMUNITY): Payer: Self-pay | Admitting: Emergency Medicine

## 2016-10-20 ENCOUNTER — Emergency Department (HOSPITAL_COMMUNITY): Payer: Medicare Other

## 2016-10-20 ENCOUNTER — Emergency Department (HOSPITAL_COMMUNITY)
Admission: EM | Admit: 2016-10-20 | Discharge: 2016-11-19 | Disposition: E | Payer: Medicare Other | Attending: Emergency Medicine | Admitting: Emergency Medicine

## 2016-10-20 DIAGNOSIS — Z7982 Long term (current) use of aspirin: Secondary | ICD-10-CM | POA: Diagnosis not present

## 2016-10-20 DIAGNOSIS — I469 Cardiac arrest, cause unspecified: Secondary | ICD-10-CM | POA: Diagnosis present

## 2016-10-20 DIAGNOSIS — I129 Hypertensive chronic kidney disease with stage 1 through stage 4 chronic kidney disease, or unspecified chronic kidney disease: Secondary | ICD-10-CM | POA: Insufficient documentation

## 2016-10-20 DIAGNOSIS — N189 Chronic kidney disease, unspecified: Secondary | ICD-10-CM | POA: Diagnosis not present

## 2016-10-20 DIAGNOSIS — Z79899 Other long term (current) drug therapy: Secondary | ICD-10-CM | POA: Insufficient documentation

## 2016-10-20 MED ORDER — SODIUM BICARBONATE 8.4 % IV SOLN
INTRAVENOUS | Status: AC | PRN
  Administered 2016-10-20 (×3): 50 meq via INTRAVENOUS

## 2016-10-20 MED ORDER — EPINEPHRINE PF 1 MG/10ML IJ SOSY
PREFILLED_SYRINGE | INTRAMUSCULAR | Status: AC | PRN
  Administered 2016-10-20 (×2): 1 via INTRAVENOUS
  Administered 2016-10-20: 1 mg via INTRAVENOUS
  Administered 2016-10-20 (×2): 1 via INTRAVENOUS

## 2016-10-20 MED ORDER — SODIUM CHLORIDE 0.9 % IV BOLUS (SEPSIS)
1000.0000 mL | Freq: Once | INTRAVENOUS | Status: AC
  Administered 2016-10-20: 1000 mL via INTRAVENOUS

## 2016-10-20 MED ORDER — DEXTROSE 5 % IV SOLN
0.0000 ug/min | INTRAVENOUS | Status: DC
  Administered 2016-10-20: 30 ug/min via INTRAVENOUS

## 2016-10-20 MED ORDER — EPINEPHRINE PF 1 MG/10ML IJ SOSY
PREFILLED_SYRINGE | INTRAMUSCULAR | Status: AC | PRN
  Administered 2016-10-20: 1 via INTRAVENOUS
  Administered 2016-10-20: 1 mg via INTRAVENOUS

## 2016-10-20 MED ORDER — STERILE WATER FOR INJECTION IV SOLN
INTRAVENOUS | Status: DC
  Filled 2016-10-20 (×2): qty 850

## 2016-10-20 MED ORDER — SODIUM BICARBONATE 8.4 % IV SOLN
INTRAVENOUS | Status: AC | PRN
  Administered 2016-10-20: 50 meq via INTRAVENOUS

## 2016-10-20 MED ORDER — CALCIUM CHLORIDE 10 % IV SOLN
INTRAVENOUS | Status: AC | PRN
  Administered 2016-10-20: 1 g via INTRAVENOUS

## 2016-10-20 MED ORDER — EPINEPHRINE PF 1 MG/10ML IJ SOSY
PREFILLED_SYRINGE | INTRAMUSCULAR | Status: AC | PRN
  Administered 2016-10-20 (×2): 1 mg via INTRAVENOUS

## 2016-10-21 MED FILL — Medication: Qty: 1 | Status: AC

## 2016-11-10 ENCOUNTER — Ambulatory Visit: Payer: Medicare Other | Admitting: Neurology

## 2016-11-19 NOTE — Code Documentation (Signed)
Pulse check, carotid pulse present, junctional rhythm, rate of 102.

## 2016-11-19 NOTE — Code Documentation (Addendum)
Ice packs to bilat axilla and groin.  ROSC.

## 2016-11-19 NOTE — ED Notes (Signed)
Family at bedside. 

## 2016-11-19 NOTE — Code Documentation (Signed)
Loss of pulses, CPR started at this time.

## 2016-11-19 NOTE — ED Notes (Signed)
Husband at bedside, continue with full code

## 2016-11-19 NOTE — ED Notes (Signed)
NO pulses at this time, code blue called.

## 2016-11-19 NOTE — Code Documentation (Signed)
No pulse, PEA, rate of 73.  CPR continues

## 2016-11-19 NOTE — Code Documentation (Addendum)
NECKLACE YELLOW IN COLOR, HOOP EARRINGS YELLOW IN COLOR, YELLOW BRACELET WITH MULTICOLORED STONES GIVEN TO DAUGHTER.  RINGS 3 GOLD GIVEN TO HUSBAND AND DAUGHTER

## 2016-11-19 NOTE — Progress Notes (Signed)
   10/23/2016 0255  Clinical Encounter Type  Visited With Patient and family together  Visit Type ED;Death  Spiritual Encounters  Spiritual Needs Emotional;Prayer;Grief support  Stress Factors  Patient Stress Factors Major life changes  Family Stress Factors Family relationships  Pt's husband in consult B then brought to room. Ministry of presence. Memorial prayer when Pt died and grief support.

## 2016-11-19 NOTE — Code Documentation (Signed)
Pulse check, no pulse, PEA rate of 83.

## 2016-11-19 NOTE — ED Notes (Signed)
[name]

## 2016-11-19 NOTE — Code Documentation (Signed)
TOD 0932 2016-10-29 called by Betsey Holiday MD

## 2016-11-19 NOTE — Progress Notes (Signed)
eLink Physician-Brief Progress Note Patient Name: PRINCELLA JASKIEWICZ DOB: [birth year] MRN: [id number]   Date of Service  11-09-2016  HPI/Events of Note  76 yo with acute cardiac arrest Patient called EMS after having 1 hour of N/V/D and back pain.  Patient was found to be tachycardic and hypotensive.  En route to ED, patient had rightward gaze, became pale, diaphoretic then bradyed down and became pulseless and apneic.  CPR started at 0218.  Patient was given 1 epi and 1 round of cpr, ROSC obtained.  Patient intubated with 7.0 ETT tube, I/O obtained.  Patient bradyed down again, became pulseless and apneic, given 2 epis, 3 rounds of CPR.  Patient obtained ROSC, patient began looking around and biting ETT tube, was given 5mg  Versed and 50 mcg of Fentanyl.  Patient bradyed down again at 0250, CPR restarted and arrival into ED.  Coded 3 times  eICU Interventions  Admit to ICU ?hypothermia protocol Cardiogenic shock Prognosis is very poor     Intervention Category Evaluation Type: New Patient Evaluation  Jasin Brazel Nov 09, 2016, 3:17 AM

## 2016-11-19 NOTE — Progress Notes (Signed)
RT NOTE:  TOD called @ 0414 by MD. ETT will remain in place.

## 2016-11-19 NOTE — Code Documentation (Signed)
Pulse check, no pulse, PEA rate of 40's.  CPR resumed.

## 2016-11-19 NOTE — ED Notes (Signed)
CHAPLAIN WAS PAGED IMMEDIATELY

## 2016-11-19 NOTE — ED Provider Notes (Signed)
[facility] DEPT Provider Note   CSN: [id number] Arrival date & time: 11-01-2016  0254  By signing my name below, I, Margit Banda, attest that this documentation has been prepared under the direction and in the presence of Orpah Greek, MD. Electronically Signed: Margit Banda, ED Scribe. 11/01/2016. 3:25 AM.  LEVEL V CAVEAT: HPI and ROS limited due to severe respiratory distress.   History   Chief Complaint Chief Complaint  Patient presents with  . Cardiac Arrest    HPI Darlene Ball is a 76 y.o. female BIB EMS, who presents to the Emergency Department complaining of sudden back pain, nausea, vomiting and diarrhea PTA.  Pt called EMS for sx that lasted ~ 1 hour. En route to ED she became hypotensive and tachycardic (2:16 am 11/01/16). Per EMS, she became pale with rightward gaze, diaphoretic then bradyed down. At 0218  MS conducted 1 round of CPR and 1 round of epi, ROSC obtained. Intubated with 7.0 ETT tube, I/O obtained. At 0228 pt bradyed down again, became pulseless and apneic. Given 2 epis, 3 rounds of CPR. ROSC obtained, and she began looking around and biting ETT tube. She was given 5mg  Versed and 50 mg of Fentanyl. Pt coded a third time right before arriving to the ED (0250).   Pt and her husband had gone out to dinner to [employer] ~ 6 pm and returned home afterwards. Pt was at baseline until sx suddenly occurred.   The history is provided by the patient, the EMS personnel and the spouse. The history is limited by the condition of the patient. No language interpreter was used.    Past Medical History:  Diagnosis Date  . Anxiety   . Arthritis   . Bowel obstruction ([place])   . Bursitis    hips and shoulders  . Cerebrovascular disease   . Chronic kidney disease   . Gait disturbance 10/31/2014  . GERD (gastroesophageal reflux disease)   . H/O hiatal hernia   . Hemorrhoids   . Hypertension   . Hypertriglyceridemia   . IBS (irritable bowel syndrome)   .  Memory difficulty 10/31/2014  . Memory loss   . OA (osteoarthritis) of knee    right  . Panic disorder   . Scoliosis   . Tinnitus   . Tubulovillous adenoma     Patient Active Problem List   Diagnosis Date Noted  . 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 Memory difficulty 10/31/2014  . Gait disturbance 10/31/2014    Past Surgical History:  Procedure Laterality Date  . ABDOMINAL HYSTERECTOMY    . Bunionectomy Bilateral    . Catarct Surgery Bilateral    . COLONOSCOPY WITH PROPOFOL N/A 09/11/2013   Procedure: COLONOSCOPY WITH PROPOFOL;  Surgeon: Garlan Fair, MD;  Location: [facility] ENDOSCOPY;  Service: Endoscopy;  Laterality: N/A;  . ESOPHAGOGASTRODUODENOSCOPY (EGD) WITH PROPOFOL N/A 09/11/2013   Procedure: ESOPHAGOGASTRODUODENOSCOPY (EGD) WITH PROPOFOL;  Surgeon: Garlan Fair, MD;  Location: [facility] ENDOSCOPY;  Service: Endoscopy;  Laterality: N/A;  . INSERTION OF MESH N/A 01/14/2016   Procedure: INSERTION OF MESH;  Surgeon: Ralene Ok, MD;  Location: [facility] ORS;  Service: General;  Laterality: N/A;  . ROTATOR CUFF REPAIR      OB History    No data available       Home Medications    Prior to Admission medications   Medication Sig Start Date End Date Taking? Authorizing Provider  acetaminophen (TYLENOL) 325 MG tablet Take 650 mg  by mouth every 6 (six) hours as needed for mild pain.    Historical Provider, MD  aspirin 81 MG tablet Take 81 mg by mouth every evening.     Historical Provider, MD  clonazePAM (KLONOPIN) 1 MG tablet Take 1-1.5 mg by mouth 2 (two) times daily as needed. Pt is to take 1mg  every morning and 1.5mg  at bedtime    Historical Provider, MD  donepezil (ARICEPT) 5 MG tablet Take 1 tablet (5 mg total) by mouth daily. 08/02/16   Ward Givens, NP  fluticasone (FLONASE) 50 MCG/ACT nasal spray Place 2 sprays into both nostrils daily as needed for allergies.     Historical Provider, MD  lisinopril-hydrochlorothiazide  (PRINZIDE,ZESTORETIC) 20-12.5 MG per tablet Take 1 tablet by mouth every morning.    Historical Provider, MD  megestrol (MEGACE) 20 MG tablet Take 20 mg by mouth 2 (two) times daily. 05/09/16   Historical Provider, MD  metoprolol tartrate (LOPRESSOR) 25 MG tablet Take 12.5 mg by mouth 2 (two) times daily.    Historical Provider, MD  Multiple Vitamin (MULTIVITAMIN WITH MINERALS) TABS tablet Take 1 tablet by mouth daily.    Historical Provider, MD  Multiple Vitamins-Minerals (HAIR SKIN NAILS PO) Take 1 tablet by mouth daily.    Historical Provider, MD  PARoxetine (PAXIL) 30 MG tablet Take 30 mg by mouth every morning.    Historical Provider, MD  simvastatin (ZOCOR) 10 MG tablet Take 10 mg by mouth daily.  04/23/15   Historical Provider, MD    Family History Family History  Problem Relation Age of Onset  . Breast cancer Mother   . Addison's disease Mother   . Dementia Mother   . Stroke Father   . Addison's disease Maternal Aunt     Social History Social History  Substance Use Topics  . Smoking status: Never Smoker  . Smokeless tobacco: Never Used  . Alcohol use No     Allergies   Codeine   Review of Systems Review of Systems  Unable to perform ROS: Severe respiratory distress     Physical Exam Updated Vital Signs BP (!) 0/0   Pulse (!) 0   Temp (!) 96.9 F (36.1 C) (Rectal)   Resp (!) 0   Ht 5\' 5"  (1.651 m)   Wt 132 lb (59.9 kg)   SpO2 (!) 0%   BMI 21.97 kg/m   Physical Exam  Constitutional: She is oriented to person, place, and time. She appears well-developed and well-nourished. No distress.  HENT:  Head: Normocephalic and atraumatic.  Right Ear: Hearing normal.  Left Ear: Hearing normal.  Nose: Nose normal.  Mouth/Throat: Oropharynx is clear and moist and mucous membranes are normal.  Eyes: Conjunctivae and EOM are normal. Pupils are equal, round, and reactive to light.  Neck: Normal range of motion. Neck supple.  Cardiovascular: Regular rhythm, S1 normal  and S2 normal.  Exam reveals no gallop and no friction rub.   No murmur heard. Pulmonary/Chest: Effort normal and breath sounds normal. No respiratory distress. She exhibits no tenderness.  Abdominal: Soft. Normal appearance and bowel sounds are normal. There is no hepatosplenomegaly. There is no tenderness. There is no rebound, no guarding, no tenderness at McBurney's point and negative Murphy's sign. No hernia.  Musculoskeletal: Normal range of motion.  Neurological: She is alert and oriented to person, place, and time. She has normal strength. No cranial nerve deficit or sensory deficit. Coordination normal. GCS eye subscore is 4. GCS verbal subscore is 5. GCS motor subscore  is 6.  Skin: Skin is warm, dry and intact. No rash noted. No cyanosis.  Psychiatric: She has a normal mood and affect. Her speech is normal and behavior is normal. Thought content normal.  Nursing note and vitals reviewed.    ED Treatments / Results  DIAGNOSTIC STUDIES: Oxygen Saturation is 97% on RA, normal by my interpretation.   Labs (all labs ordered are listed, but only abnormal results are displayed) Labs Reviewed  I-STAT CG4 LACTIC ACID, ED  Randolm Idol, ED  I-STAT VENOUS BLOOD GAS, ED    EKG  EKG Interpretation None       Radiology Dg Chest Portable 1 View  Result Date: 10/22/16 CLINICAL DATA:  Respiratory failure EXAM: PORTABLE CHEST 1 VIEW COMPARISON:  05/29/2015 FINDINGS: Endotracheal tube is 4.8 cm above the carina. Enteric tube extends into the stomach and beyond the inferior edge of the image. Patchy areas of consolidation in the central and upper lobe regions bilaterally. Bases are relatively clear. No large effusion. No pneumothorax. IMPRESSION: 1.  Support equipment appears satisfactorily positioned. 2. Patchy consolidation in the upper lobes and central regions bilaterally. Electronically Signed   By: Andreas Newport M.D.   On: 10/22/2016 03:35    Procedures Procedures  (including critical care time)  Medications Ordered in ED Medications  norepinephrine (LEVOPHED) 4 mg in dextrose 5 % 250 mL (0.016 mg/mL) infusion (0 mcg/min Intravenous Stopped Oct 22, 2016 0416)  sodium chloride 0.9 % bolus 1,000 mL (0 mLs Intravenous Stopped 10-22-2016 0416)  sodium bicarbonate injection (50 mEq Intravenous Given 22-Oct-2016 0350)  EPINEPHrine (ADRENALIN) 1 MG/10ML injection (1 Syringe Intravenous Given 22-Oct-2016 0300)  EPINEPHrine (ADRENALIN) 1 MG/10ML injection (1 mg Intravenous Given Oct 22, 2016 0403)  sodium bicarbonate injection (50 mEq Intravenous Given Oct 22, 2016 0342)  calcium chloride injection (1 g Intravenous Given Oct 22, 2016 0333)  sodium bicarbonate injection (50 mEq Intravenous Given 22-Oct-2016 0404)  EPINEPHrine (ADRENALIN) 1 MG/10ML injection (1 mg Intravenous Given 10/22/16 0409)     Initial Impression / Assessment and Plan / ED Course  I have reviewed the triage vital signs and the nursing notes.  Pertinent labs & imaging results that were available during my care of the patient were reviewed by me and considered in my medical decision making (see chart for details).     Patient brought to the ER by ambulance. Patient reportedly has been doing well yesterday, but awakened with nausea, vomiting and diarrhea. Husband called EMS. EMS report that upon their arrival she became very weak, then bradycardic and collapsed. There are unable to find pulses, initiated CPR. Patient was intubated. She given a dose of epi and spontaneous circulation returned. Patient was transported to the ER. During transport she lost pulses 2 more times and CPR, epi administered.  At arrival to the ER, patient was undergoing CPR. ACLS protocols were followed. CPR was continued and patient was given doses of epinephrine. Intermittently return of spontaneous circulation was achieved. Patient was placed on norepinephrine drip. Patient would intermittently be in a junctional rhythm or sinus tachycardia or atrial  fibrillation, then lose pulses and require CPR again.  Patient's husband was brought into the room to be at her bedside. He witnessed multiple rounds of CPR. He reported that he wished to continue these efforts for as long as it took.  Ultimately, patient entered into PEA and did not respond to CPR or epinephrine. Patient was declared dead at 37.  Dr. Josetta Huddle, on call for her primary care physician was contacted. He was informed of the  patient's death and will sign death certificate.  CRITICAL CARE Performed by: Orpah Greek   Total critical care time: 60 minutes  Critical care time was exclusive of separately billable procedures and treating other patients.  Critical care was necessary to treat or prevent imminent or life-threatening deterioration.  Critical care was time spent personally by me on the following activities: development of treatment plan with patient and/or surrogate as well as nursing, discussions with consultants, evaluation of patient's response to treatment, examination of patient, obtaining history from patient or surrogate, ordering and performing treatments and interventions, ordering and review of laboratory studies, ordering and review of radiographic studies, pulse oximetry and re-evaluation of patient's condition.  Cardiopulmonary Resuscitation (CPR) Procedure Note Directed/Performed by: Orpah Greek I personally directed ancillary staff and/or performed CPR in an effort to regain return of spontaneous circulation and to maintain cardiac, neuro and systemic perfusion.    Final Clinical Impressions(s) / ED Diagnoses   Final diagnoses:  Cardiopulmonary arrest [facility])    New Prescriptions Discharge Medication List as of 11/15/16  6:54 AM     I personally performed the services described in this documentation, which was scribed in my presence. The recorded information has been reviewed and is accurate.     Orpah Greek,  MD Nov 15, 2016 [phone]

## 2016-11-19 NOTE — ED Triage Notes (Addendum)
Patient called EMS after having 1 hour of N/V/D and back pain.  Patient was found to be tachycardic and hypotensive.  En route to ED, patient had rightward gaze, became pale, diaphoretic then bradyed down and became pulseless and apneic.  CPR started at 0218.  Patient was given 1 epi and 1 round of cpr, ROSC obtained.  Patient intubated with 7.0 ETT tube, I/O obtained.  Patient bradyed down again, became pulseless and apneic, given 2 epis, 3 rounds of CPR.  Patient obtained ROSC, patient began looking around and biting ETT tube, was given 5mg  Versed and 50 mcg of Fentanyl.  Patient bradyed down again at 0250, CPR restarted and arrival into ED.  CBG of 273, Capnography of 13.

## 2016-11-19 NOTE — ED Notes (Signed)
No cardiac movement on ECHO per Pollina MD.  Daughter and husband at bedside

## 2016-11-19 NOTE — Code Documentation (Signed)
Ice packs placed on axilla and groin

## 2016-11-19 NOTE — Code Documentation (Signed)
Pulse check. No pulse, PEA rate of 40.

## 2016-11-19 DEATH — deceased

## 2016-11-29 ENCOUNTER — Ambulatory Visit: Payer: Medicare Other | Admitting: Neurology

## 2016-12-18 IMAGING — CT CT ABD-PELV W/O CM
2 of 4 series · 17 of 46 positions shown, 19 images · non-contrast
Comparison: 07/23/2010

CLINICAL DATA: Weakness and diarrhea

EXAM:
CT ABDOMEN AND PELVIS WITHOUT CONTRAST
TECHNIQUE: Multidetector CT imaging of the abdomen and pelvis was performed
following the standard protocol without IV contrast.

[Series 2: rtn a/p w/o · axial · non-contrast · 0.74mm/px · z∈[-452,-42]mm · 14 of 94 slices shown, 16 images]
[im 6/94  soft-tissue]
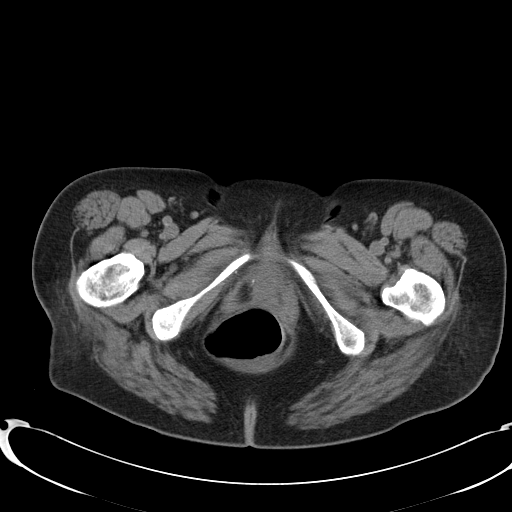
[im 6/94  bone]
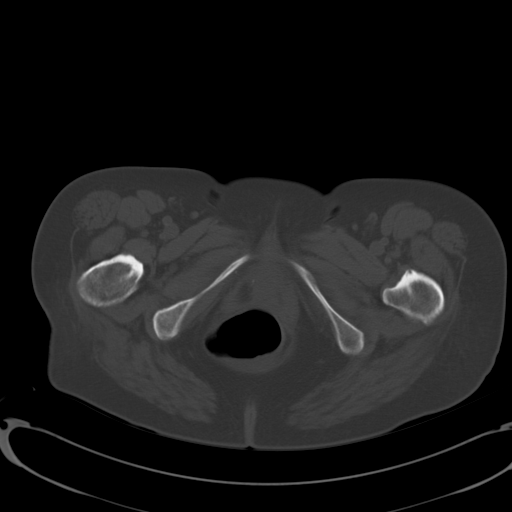
[im 12/94  soft-tissue]
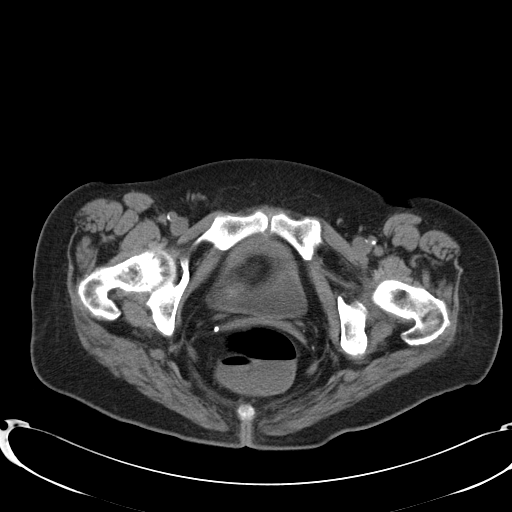
[im 18/94  soft-tissue]
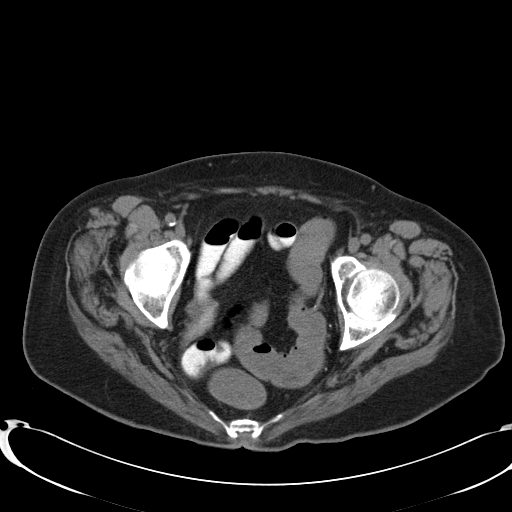
[im 24/94  soft-tissue]
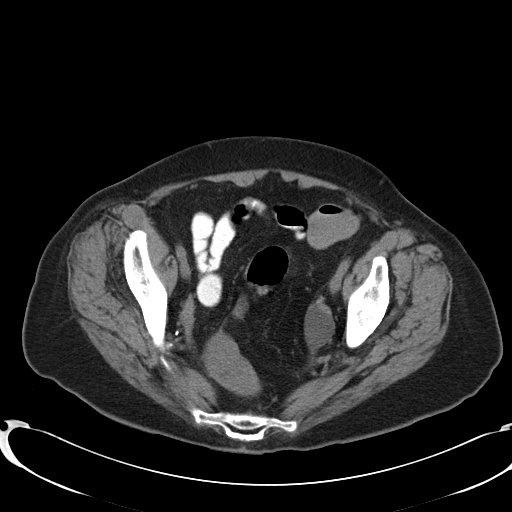
[im 30/94  soft-tissue]
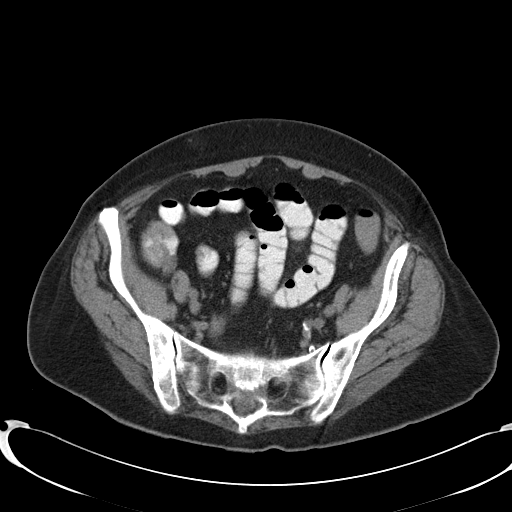
[im 35/94  soft-tissue]
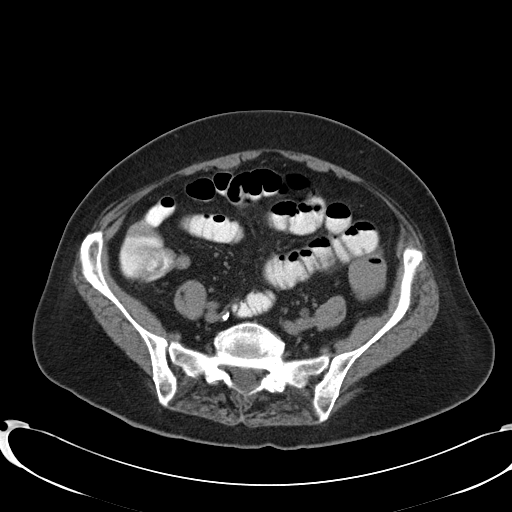
[im 41/94  soft-tissue]
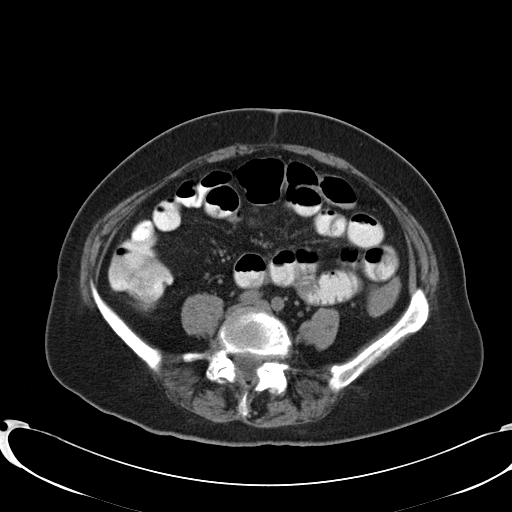
[im 53/94  soft-tissue]
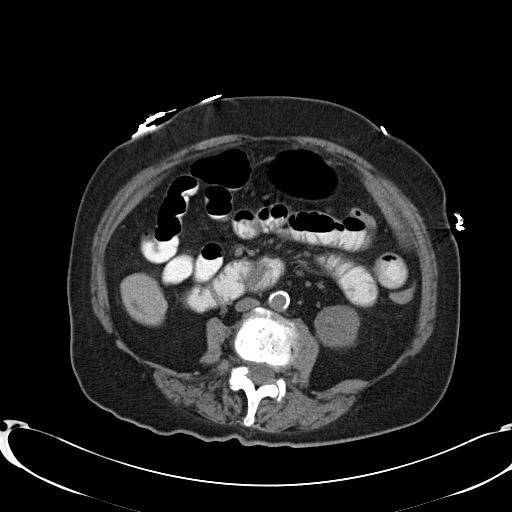
[im 59/94  soft-tissue]
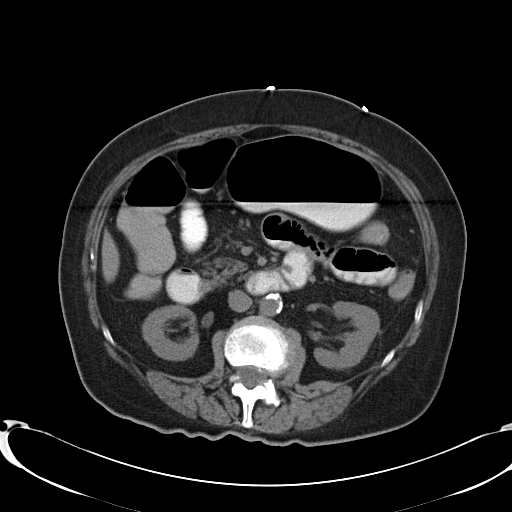
[im 59/94  bone]
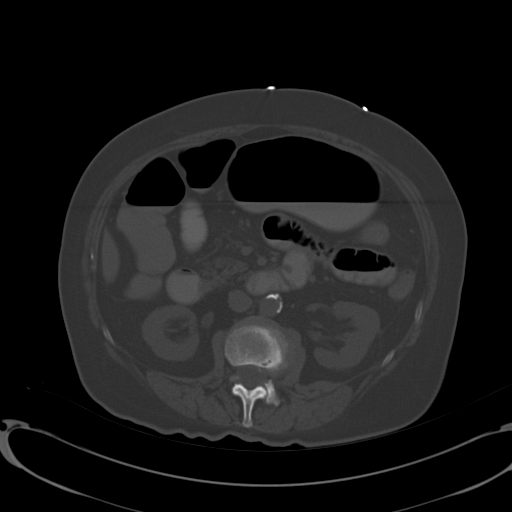
[im 64/94  soft-tissue]
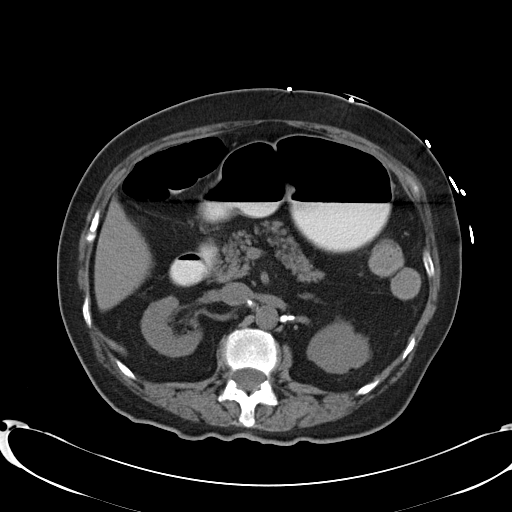
[im 70/94  soft-tissue]
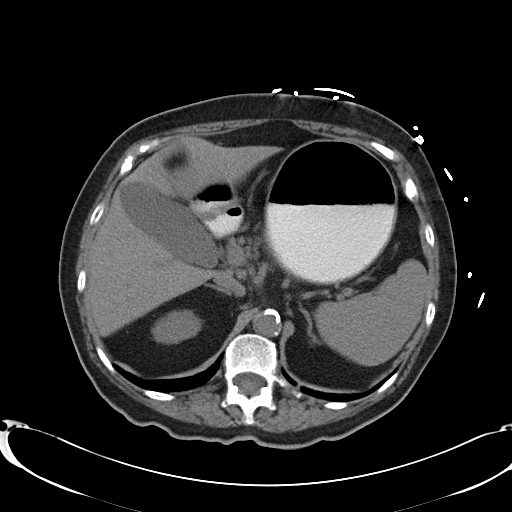
[im 76/94  soft-tissue]
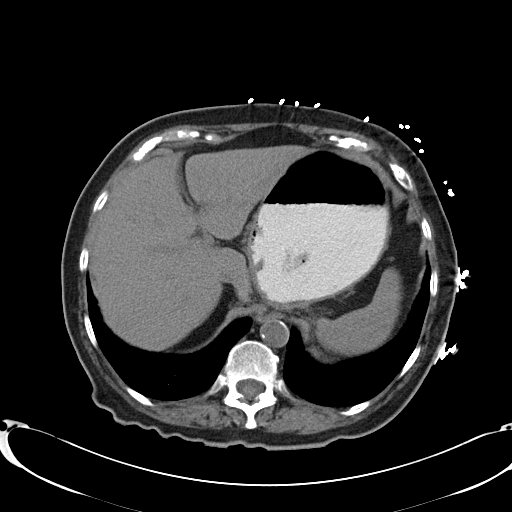
[im 82/94  soft-tissue]
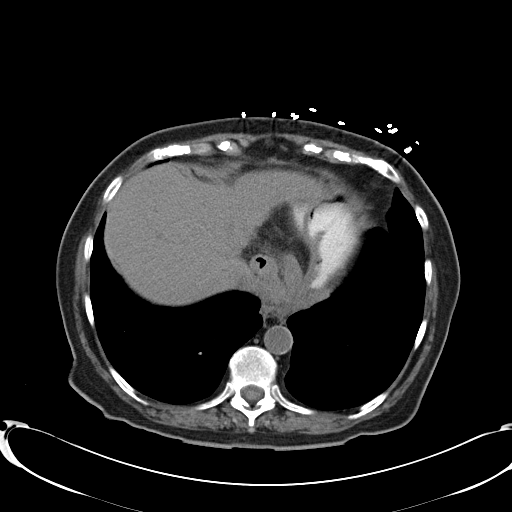
[im 88/94  soft-tissue]
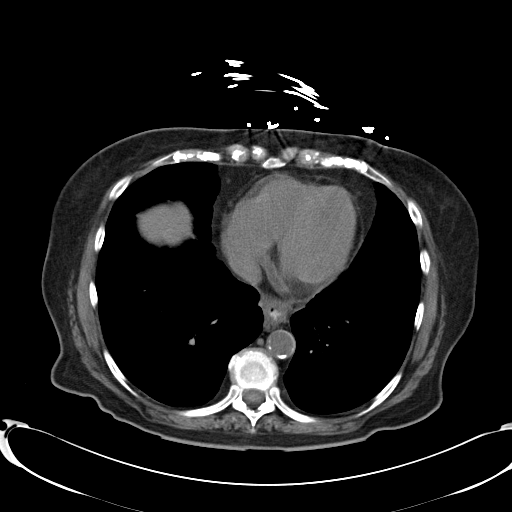

[Series 603: <mpr thick range(1)> · coronal · 0.92mm/px · 3 of 138 slices shown]
[im 46/138  soft-tissue]
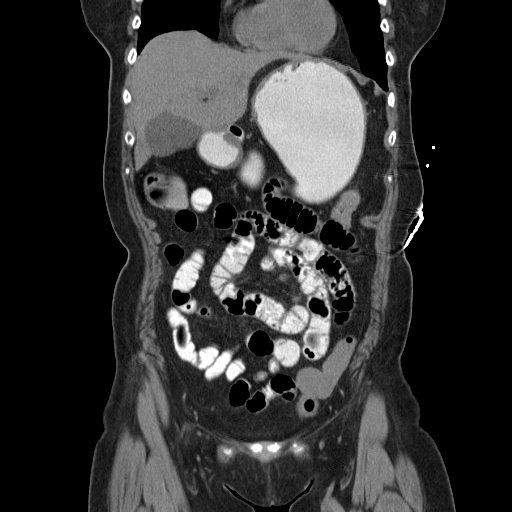
[im 61/138  soft-tissue]
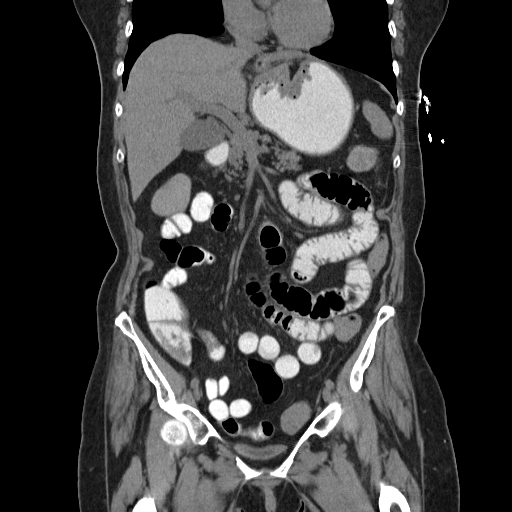
[im 77/138  soft-tissue]
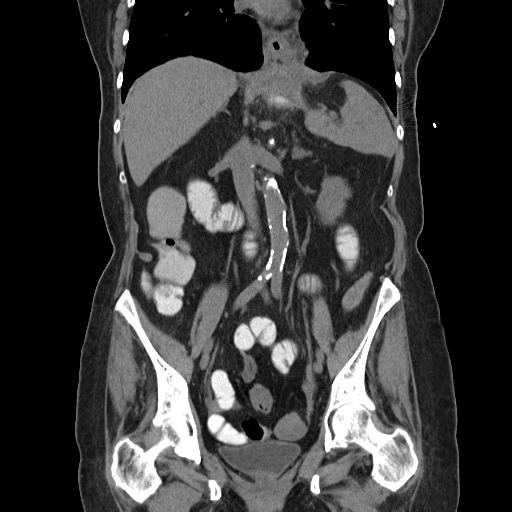

[17 of 46 positions shown; findings below may reference images not displayed]

FINDINGS: Lower chest: 6 mm nodule is noted in the right lower lobe on the
first image of series 4. No other nodules are seen. This nodule is
stable from a prior exam of 4504.

Hepatobiliary: No mass visualized on this un-enhanced exam.

Pancreas: No mass or inflammatory process identified on this
un-enhanced exam.

Spleen: Within normal limits in size.

Adrenals/Urinary Tract: No evidence of urolithiasis or
hydronephrosis. No definite mass visualized on this un-enhanced
exam.

Stomach/Bowel: Changes of prior Nissen fundoplication. Stomach is
well distended. Mild proximal small bowel dilatation is noted
without definitive transition zone or obstructive mass. The appendix
is within normal limits. Mild diverticular change is seen.

Vascular/Lymphatic: No aortic aneurysm is identified. Scattered
small lymph nodes are noted within the mesenteric likely reactive in
nature.

Reproductive: Uterus has been surgically removed.

Other: None.

Musculoskeletal:  No suspicious bone lesions identified.
IMPRESSION: Stable 6 mm nodule in the right lower lobe. No further follow-up is
necessary.

Mild small bowel dilatation proximally in without definitive
transition zone or obstructive mass.

No other focal abnormality is noted.

## 2017-01-31 ENCOUNTER — Ambulatory Visit: Payer: Medicare Other | Admitting: Neurology
